# Patient Record
Sex: Male | Born: 1965
Health system: Southern US, Community
[De-identification: ages and names within clinical notes are randomized; demographics above are authoritative.]

## PROBLEM LIST (undated history)

## (undated) DIAGNOSIS — Z720 Tobacco use: Secondary | ICD-10-CM

## (undated) DIAGNOSIS — R9439 Abnormal result of other cardiovascular function study: Secondary | ICD-10-CM

## (undated) DIAGNOSIS — F319 Bipolar disorder, unspecified: Secondary | ICD-10-CM

## (undated) DIAGNOSIS — I4892 Unspecified atrial flutter: Secondary | ICD-10-CM

## (undated) DIAGNOSIS — F1010 Alcohol abuse, uncomplicated: Secondary | ICD-10-CM

## (undated) HISTORY — PX: OTHER SURGICAL HISTORY: SHX169

## (undated) HISTORY — DX: Abnormal result of other cardiovascular function study: R94.39

---

## 2000-07-26 ENCOUNTER — Emergency Department (HOSPITAL_COMMUNITY): Admission: EM | Admit: 2000-07-26 | Discharge: 2000-07-26 | Payer: Self-pay | Admitting: Emergency Medicine

## 2000-07-26 ENCOUNTER — Encounter: Payer: Self-pay | Admitting: Emergency Medicine

## 2000-07-29 ENCOUNTER — Emergency Department (HOSPITAL_COMMUNITY): Admission: EM | Admit: 2000-07-29 | Discharge: 2000-07-29 | Payer: Self-pay | Admitting: Emergency Medicine

## 2000-08-10 ENCOUNTER — Encounter: Payer: Self-pay | Admitting: Emergency Medicine

## 2000-08-10 ENCOUNTER — Emergency Department (HOSPITAL_COMMUNITY): Admission: EM | Admit: 2000-08-10 | Discharge: 2000-08-10 | Payer: Self-pay | Admitting: Emergency Medicine

## 2000-08-15 ENCOUNTER — Emergency Department (HOSPITAL_COMMUNITY): Admission: EM | Admit: 2000-08-15 | Discharge: 2000-08-15 | Payer: Self-pay | Admitting: Emergency Medicine

## 2000-08-20 ENCOUNTER — Emergency Department (HOSPITAL_COMMUNITY): Admission: EM | Admit: 2000-08-20 | Discharge: 2000-08-20 | Payer: Self-pay | Admitting: Emergency Medicine

## 2014-03-27 ENCOUNTER — Inpatient Hospital Stay (HOSPITAL_COMMUNITY)
Admission: EM | Admit: 2014-03-27 | Discharge: 2014-03-31 | DRG: 273 | Disposition: A | Discharge status: Home / Self Care | Payer: Self-pay | Attending: Internal Medicine | Admitting: Internal Medicine

## 2014-03-27 ENCOUNTER — Encounter (HOSPITAL_COMMUNITY): Payer: Self-pay | Admitting: Physical Medicine and Rehabilitation

## 2014-03-27 ENCOUNTER — Emergency Department (HOSPITAL_COMMUNITY): Payer: Self-pay

## 2014-03-27 DIAGNOSIS — F1290 Cannabis use, unspecified, uncomplicated: Secondary | ICD-10-CM | POA: Diagnosis present

## 2014-03-27 DIAGNOSIS — Z720 Tobacco use: Secondary | ICD-10-CM

## 2014-03-27 DIAGNOSIS — I4891 Unspecified atrial fibrillation: Secondary | ICD-10-CM | POA: Diagnosis present

## 2014-03-27 DIAGNOSIS — Z801 Family history of malignant neoplasm of trachea, bronchus and lung: Secondary | ICD-10-CM

## 2014-03-27 DIAGNOSIS — F3130 Bipolar disorder, current episode depressed, mild or moderate severity, unspecified: Secondary | ICD-10-CM | POA: Diagnosis present

## 2014-03-27 DIAGNOSIS — I483 Typical atrial flutter: Principal | ICD-10-CM | POA: Diagnosis present

## 2014-03-27 DIAGNOSIS — D72829 Elevated white blood cell count, unspecified: Secondary | ICD-10-CM | POA: Diagnosis present

## 2014-03-27 DIAGNOSIS — F1020 Alcohol dependence, uncomplicated: Secondary | ICD-10-CM

## 2014-03-27 DIAGNOSIS — I4892 Unspecified atrial flutter: Secondary | ICD-10-CM

## 2014-03-27 DIAGNOSIS — E860 Dehydration: Secondary | ICD-10-CM | POA: Diagnosis present

## 2014-03-27 DIAGNOSIS — I5021 Acute systolic (congestive) heart failure: Secondary | ICD-10-CM | POA: Diagnosis present

## 2014-03-27 DIAGNOSIS — Z66 Do not resuscitate: Secondary | ICD-10-CM | POA: Diagnosis present

## 2014-03-27 DIAGNOSIS — Z7982 Long term (current) use of aspirin: Secondary | ICD-10-CM

## 2014-03-27 DIAGNOSIS — F17210 Nicotine dependence, cigarettes, uncomplicated: Secondary | ICD-10-CM | POA: Diagnosis present

## 2014-03-27 DIAGNOSIS — R05 Cough: Secondary | ICD-10-CM | POA: Diagnosis present

## 2014-03-27 DIAGNOSIS — Z597 Insufficient social insurance and welfare support: Secondary | ICD-10-CM

## 2014-03-27 DIAGNOSIS — Z8249 Family history of ischemic heart disease and other diseases of the circulatory system: Secondary | ICD-10-CM

## 2014-03-27 DIAGNOSIS — D751 Secondary polycythemia: Secondary | ICD-10-CM | POA: Diagnosis present

## 2014-03-27 DIAGNOSIS — F411 Generalized anxiety disorder: Secondary | ICD-10-CM | POA: Diagnosis present

## 2014-03-27 HISTORY — DX: Unspecified atrial flutter: I48.92

## 2014-03-27 HISTORY — DX: Unspecified atrial fibrillation: I48.91

## 2014-03-27 HISTORY — DX: Bipolar disorder, unspecified: F31.9

## 2014-03-27 HISTORY — DX: Tobacco use: Z72.0

## 2014-03-27 HISTORY — DX: Alcohol dependence, uncomplicated: F10.20

## 2014-03-27 HISTORY — DX: Alcohol abuse, uncomplicated: F10.10

## 2014-03-27 LAB — COMPREHENSIVE METABOLIC PANEL
ALT: 27 U/L (ref 0–53)
AST: 25 U/L (ref 0–37)
Albumin: 4.1 g/dL (ref 3.5–5.2)
Alkaline Phosphatase: 93 U/L (ref 39–117)
Anion gap: 9 (ref 5–15)
BUN: 9 mg/dL (ref 6–23)
CO2: 22 mmol/L (ref 19–32)
Calcium: 9.5 mg/dL (ref 8.4–10.5)
Chloride: 108 mmol/L (ref 96–112)
Creatinine, Ser: 1.04 mg/dL (ref 0.50–1.35)
GFR calc Af Amer: >90 mL/min (ref >90)
GFR calc non Af Amer: 83 mL/min — ABNORMAL LOW (ref >90)
Glucose, Bld: 119 mg/dL — ABNORMAL HIGH (ref 70–99)
Potassium: 4.5 mmol/L (ref 3.5–5.1)
Sodium: 139 mmol/L (ref 135–145)
Total Bilirubin: 0.5 mg/dL (ref 0.3–1.2)
Total Protein: 7.1 g/dL (ref 6.0–8.3)

## 2014-03-27 LAB — CBC WITH DIFFERENTIAL/PLATELET
Basophils Absolute: 0.1 10*3/uL (ref 0.0–0.1)
Basophils Relative: 1 % (ref 0–1)
Eosinophils Absolute: 0.2 10*3/uL (ref 0.0–0.7)
Eosinophils Relative: 2 % (ref 0–5)
HCT: 51.2 % (ref 39.0–52.0)
Hemoglobin: 17.8 g/dL — ABNORMAL HIGH (ref 13.0–17.0)
Lymphocytes Relative: 27 % (ref 12–46)
Lymphs Abs: 2.6 10*3/uL (ref 0.7–4.0)
MCH: 33.8 pg (ref 26.0–34.0)
MCHC: 34.8 g/dL (ref 30.0–36.0)
MCV: 97.2 fL (ref 78.0–100.0)
Monocytes Absolute: 0.5 10*3/uL (ref 0.1–1.0)
Monocytes Relative: 5 % (ref 3–12)
Neutro Abs: 6.2 10*3/uL (ref 1.7–7.7)
Neutrophils Relative %: 65 % (ref 43–77)
Platelets: 267 10*3/uL (ref 150–400)
RBC: 5.27 MIL/uL (ref 4.22–5.81)
RDW: 13.4 % (ref 11.5–15.5)
WBC: 9.5 10*3/uL (ref 4.0–10.5)

## 2014-03-27 LAB — LITHIUM LEVEL: Lithium Lvl: 0.62 mmol/L — ABNORMAL LOW (ref 0.80–1.40)

## 2014-03-27 LAB — RAPID URINE DRUG SCREEN, HOSP PERFORMED
Amphetamines: NOT DETECTED
Barbiturates: NOT DETECTED
Benzodiazepines: NOT DETECTED
Cocaine: NOT DETECTED
Opiates: NOT DETECTED
Tetrahydrocannabinol: POSITIVE — AB

## 2014-03-27 LAB — BRAIN NATRIURETIC PEPTIDE: B Natriuretic Peptide: 169.2 pg/mL — ABNORMAL HIGH (ref 0.0–100.0)

## 2014-03-27 LAB — MAGNESIUM: MAGNESIUM: 2 mg/dL (ref 1.5–2.5)

## 2014-03-27 LAB — I-STAT TROPONIN, ED: TROPONIN I, POC: 0 ng/mL (ref 0.00–0.08)

## 2014-03-27 LAB — PROTIME-INR
INR: 1.01 (ref 0.00–1.49)
PROTHROMBIN TIME: 13.4 s (ref 11.6–15.2)

## 2014-03-27 LAB — APTT: aPTT: 28 seconds (ref 24–37)

## 2014-03-27 LAB — TROPONIN I

## 2014-03-27 LAB — TSH: TSH: 1.178 u[IU]/mL (ref 0.350–4.500)

## 2014-03-27 LAB — D-DIMER, QUANTITATIVE (NOT AT ARMC)

## 2014-03-27 LAB — HEPARIN LEVEL (UNFRACTIONATED): Heparin Unfractionated: >2.2 IU/mL — ABNORMAL HIGH (ref 0.30–0.70)

## 2014-03-27 MED ORDER — ACETAMINOPHEN 325 MG PO TABS
650.0000 mg | ORAL_TABLET | Freq: Four times a day (QID) | ORAL | Status: DC | PRN
  Administered 2014-03-31: 650 mg via ORAL
  Filled 2014-03-27: qty 2

## 2014-03-27 MED ORDER — ADULT MULTIVITAMIN W/MINERALS CH
1.0000 | ORAL_TABLET | Freq: Every day | ORAL | Status: DC
  Administered 2014-03-28 – 2014-03-31 (×4): 1 via ORAL
  Filled 2014-03-27 (×4): qty 1

## 2014-03-27 MED ORDER — LITHIUM CARBONATE 300 MG PO CAPS
1200.0000 mg | ORAL_CAPSULE | Freq: Every day | ORAL | Status: DC
  Administered 2014-03-27 – 2014-03-30 (×4): 1200 mg via ORAL
  Filled 2014-03-27 (×5): qty 4

## 2014-03-27 MED ORDER — HEPARIN BOLUS VIA INFUSION
4000.0000 [IU] | Freq: Once | INTRAVENOUS | Status: AC
  Administered 2014-03-27: 4000 [IU] via INTRAVENOUS
  Filled 2014-03-27: qty 4000

## 2014-03-27 MED ORDER — DILTIAZEM LOAD VIA INFUSION: 10.0000 mg | Freq: Once | INTRAVENOUS | Status: DC

## 2014-03-27 MED ORDER — SODIUM CHLORIDE 0.9 % IV SOLN
INTRAVENOUS | Status: AC
  Administered 2014-03-27: 10 mL/h via INTRAVENOUS

## 2014-03-27 MED ORDER — LORAZEPAM 1 MG PO TABS
1.0000 mg | ORAL_TABLET | Freq: Four times a day (QID) | ORAL | Status: AC | PRN
  Administered 2014-03-27 – 2014-03-30 (×4): 1 mg via ORAL
  Filled 2014-03-27 (×4): qty 1

## 2014-03-27 MED ORDER — LORAZEPAM 2 MG/ML IJ SOLN
1.0000 mg | Freq: Four times a day (QID) | INTRAMUSCULAR | Status: AC | PRN
  Administered 2014-03-28 – 2014-03-29 (×4): 1 mg via INTRAVENOUS
  Filled 2014-03-27 (×5): qty 1

## 2014-03-27 MED ORDER — ACETAMINOPHEN 650 MG RE SUPP: 650.0000 mg | Freq: Four times a day (QID) | RECTAL | Status: DC | PRN

## 2014-03-27 MED ORDER — ONDANSETRON HCL 4 MG PO TABS
4.0000 mg | ORAL_TABLET | Freq: Four times a day (QID) | ORAL | Status: DC | PRN
  Administered 2014-03-30: 4 mg via ORAL
  Filled 2014-03-27: qty 1

## 2014-03-27 MED ORDER — APIXABAN 5 MG PO TABS
5.0000 mg | ORAL_TABLET | Freq: Two times a day (BID) | ORAL | Status: DC
  Administered 2014-03-27 – 2014-03-31 (×8): 5 mg via ORAL
  Filled 2014-03-27 (×9): qty 1

## 2014-03-27 MED ORDER — ALUM & MAG HYDROXIDE-SIMETH 200-200-20 MG/5ML PO SUSP: 30.0000 mL | Freq: Four times a day (QID) | ORAL | Status: DC | PRN

## 2014-03-27 MED ORDER — METOPROLOL TARTRATE 25 MG PO TABS
25.0000 mg | ORAL_TABLET | Freq: Two times a day (BID) | ORAL | Status: DC
  Administered 2014-03-27 – 2014-03-29 (×4): 25 mg via ORAL
  Filled 2014-03-27 (×4): qty 1

## 2014-03-27 MED ORDER — CARBAMAZEPINE 200 MG PO TABS
400.0000 mg | ORAL_TABLET | Freq: Once | ORAL | Status: AC
  Administered 2014-03-27: 400 mg via ORAL
  Filled 2014-03-27: qty 2

## 2014-03-27 MED ORDER — DILTIAZEM LOAD VIA INFUSION
10.0000 mg | Freq: Once | INTRAVENOUS | Status: AC
  Administered 2014-03-27: 10 mg via INTRAVENOUS
  Filled 2014-03-27: qty 10

## 2014-03-27 MED ORDER — ONDANSETRON HCL 4 MG/2ML IJ SOLN: 4.0000 mg | Freq: Four times a day (QID) | INTRAMUSCULAR | Status: DC | PRN

## 2014-03-27 MED ORDER — DILTIAZEM HCL 100 MG IV SOLR
5.0000 mg/h | INTRAVENOUS | Status: DC
  Administered 2014-03-28: 10 mg/h via INTRAVENOUS
  Filled 2014-03-27: qty 100

## 2014-03-27 MED ORDER — SODIUM CHLORIDE 0.9 % IJ SOLN
3.0000 mL | Freq: Two times a day (BID) | INTRAMUSCULAR | Status: DC
  Administered 2014-03-27 – 2014-03-30 (×5): 3 mL via INTRAVENOUS

## 2014-03-27 MED ORDER — VITAMIN B-1 100 MG PO TABS
100.0000 mg | ORAL_TABLET | Freq: Every day | ORAL | Status: DC
  Administered 2014-03-28 – 2014-03-31 (×4): 100 mg via ORAL
  Filled 2014-03-27 (×4): qty 1

## 2014-03-27 MED ORDER — FOLIC ACID 1 MG PO TABS
1.0000 mg | ORAL_TABLET | Freq: Every day | ORAL | Status: DC
  Administered 2014-03-28 – 2014-03-31 (×4): 1 mg via ORAL
  Filled 2014-03-27 (×4): qty 1

## 2014-03-27 MED ORDER — FUROSEMIDE 10 MG/ML IJ SOLN
40.0000 mg | Freq: Two times a day (BID) | INTRAMUSCULAR | Status: DC
  Administered 2014-03-27 – 2014-03-28 (×2): 40 mg via INTRAVENOUS
  Filled 2014-03-27 (×2): qty 4

## 2014-03-27 MED ORDER — NICOTINE 21 MG/24HR TD PT24
21.0000 mg | MEDICATED_PATCH | Freq: Once | TRANSDERMAL | Status: DC
  Administered 2014-03-27: 21 mg via TRANSDERMAL
  Filled 2014-03-27: qty 1

## 2014-03-27 MED ORDER — HEPARIN (PORCINE) IN NACL 100-0.45 UNIT/ML-% IJ SOLN
1100.0000 [IU]/h | INTRAMUSCULAR | Status: DC
  Administered 2014-03-27: 1100 [IU]/h via INTRAVENOUS
  Filled 2014-03-27: qty 250

## 2014-03-27 MED ORDER — POTASSIUM CHLORIDE CRYS ER 20 MEQ PO TBCR
20.0000 meq | EXTENDED_RELEASE_TABLET | Freq: Two times a day (BID) | ORAL | Status: DC
  Administered 2014-03-27 – 2014-03-31 (×8): 20 meq via ORAL
  Filled 2014-03-27 (×8): qty 1

## 2014-03-27 MED ORDER — DILTIAZEM HCL 100 MG IV SOLR
5.0000 mg/h | INTRAVENOUS | Status: DC
  Administered 2014-03-27: 5 mg/h via INTRAVENOUS

## 2014-03-27 MED ORDER — THIAMINE HCL 100 MG/ML IJ SOLN
100.0000 mg | Freq: Every day | INTRAMUSCULAR | Status: DC
  Filled 2014-03-27: qty 2

## 2014-03-27 MED ORDER — SENNOSIDES-DOCUSATE SODIUM 8.6-50 MG PO TABS: 1.0000 | ORAL_TABLET | Freq: Every evening | ORAL | Status: DC | PRN

## 2014-03-27 MED ORDER — CARBAMAZEPINE 200 MG PO TABS
400.0000 mg | ORAL_TABLET | Freq: Every day | ORAL | Status: DC
  Administered 2014-03-28 – 2014-03-30 (×3): 400 mg via ORAL
  Filled 2014-03-27 (×3): qty 2

## 2014-03-27 NOTE — H&P (Signed)
[facility] History and Physical                                                                                    Yaseen Gilberg, is a 49 y.o. male  MRN: [id number]   DOB - [birth year]  Admit Date - 03/27/2014  Outpatient Primary MD for the patient is No primary care provider on file.  With History of -  History reviewed. No pertinent past medical history.    History reviewed. No pertinent past surgical history.  in for   Chief Complaint  Patient presents with  . Chest Pain  . Shortness of Breath     HPI Krystopher Kuenzel  is a 49 y.o. male, with a pmh of manic depression, who presents with fatigue, DOE, and cold symptoms for the past several months.  He has been anxious because these symptoms are not resolving.  Earlier this week he decided he couldn't stand it anymore and planned to come to the ER when his twin sister was available to take him.  He has no hx of hypertension, says his BP has been fine when he goes to mental health appointments, but recently he has checked his BP at the pharmacy twice over the last month and found it to be elevated. He also complains of exertional dyspnea, claims that even walking one block makes her short of breath-he has to stop and take a breath/rest and then continue walking. He denies cp, palpitations orthopnea, PND, and lower extremity swelling.  He has smoked 1.5 packs of cigarettes for the past 30 years.  He drinks approx 2 forty ounce beers daily.    In the ER he is found to be in Aflutter with a rate of 130 - 140 bpm.  His hgb is 17.8.  DDimer is negative and POC troponin is 0.0.  Review of Systems   In addition to the HPI above,  No Fever-chills. ++ headaches.  He takes aspirin for these with some relief.  No changes in vision or hearing. No Abdominal pain, He reports vomiting clear phlegm 2x recently. No Blood in stool or Urine, No dysuria, No new tingling, numbness in any extremity, No recent weight gain or loss,  A full 10 point  Review of Systems was done, except as stated above, all other Review of Systems were negative.  Social History History  Substance Use Topics  . Smoking status: Current Every Day Smoker  . Smokeless tobacco: Not on file  . Alcohol Use: No  Tobacco use 1.5 ppd for 30 years.  ETOH 2 forty ounce beers daily. He works as a Copy.  Family History No family history on file. Mother had a heart attack at age 76.  Father died of lung cancer.  Both are deceased.  Sister had ovarian cancer.   Prior to Admission medications   Not on File    No Known Allergies  Physical Exam  Vitals  Blood pressure 129/95, pulse 131, temperature 98.7 F (37.1 C), temperature source Oral, resp. rate 23, SpO2 97 %.   General:  Wd, thin, male, pleasant and cooperative, appears stated age.  Mildly anxious. Sister at bedside.  Psych:  Normal affect and insight, Not Suicidal or Homicidal, Awake Alert, Oriented X 3.  Neuro:   No F.N deficits, ALL C.Nerves Intact, Strength 5/5 all 4 extremities, Sensation intact all 4 extremities.  ENT:  Ears and Eyes appear Normal, Conjunctivae clear, PERRL, Slightly dry oral mucosa with pale/white roof of mouth. Poor dentition.   Neck:  Supple, No lymphadenopathy appreciated. Flushed pink skin.  Respiratory:  Symmetrical chest wall movement, Good air movement bilaterally, CTAB.  Cardiac: Tachycardic, S1 and S2 auscultated, no murmurs/rubs/gallops, no LE edema noted, no JVD.    Abdomen:  Positive bowel sounds, Soft, Non tender, Non distended,  No masses appreciated  Skin:  No Cyanosis, Normal Skin Turgor, No Skin Rash or Bruise. ++Face and neck skin flushed pink.  Extremities:  Able to move all 4. 5/5 strength in each,  no effusions. ++ clubbing.  Data Review  CBC  Recent Labs Lab 03/27/14 1341  WBC 9.5  HGB 17.8*  HCT 51.2  PLT 267  MCV 97.2  MCH 33.8  MCHC 34.8  RDW 13.4  LYMPHSABS 2.6  MONOABS 0.5  EOSABS 0.2  BASOSABS 0.1    Chemistries    Recent Labs Lab 03/27/14 1341 03/27/14 1417  NA 139  --   K 4.5  --   CL 108  --   CO2 22  --   GLUCOSE 119*  --   BUN 9  --   CREATININE 1.04  --   CALCIUM 9.5  --   MG  --  2.0  AST 25  --   ALT 27  --   ALKPHOS 93  --   BILITOT 0.5  --     CrCl cannot be calculated (Unknown ideal weight.).   Recent Labs  03/27/14 1417  TSH 1.178    Coagulation profile  Recent Labs Lab 03/27/14 1417  INR 1.01     Recent Labs  03/27/14 1417  DDIMER <0.27    Cardiac Enzymes No results for input(s): CKMB, TROPONINI, MYOGLOBIN in the last 168 hours.  Invalid input(s): CK  Invalid input(s): POCBNP  Urinalysis No results found for: COLORURINE, APPEARANCEUR, LABSPEC, PHURINE, GLUCOSEU, HGBUR, BILIRUBINUR, KETONESUR, PROTEINUR, UROBILINOGEN, NITRITE, LEUKOCYTESUR  Imaging results:   Dg Chest 2 View  03/27/2014   CLINICAL DATA:  Chest pain and shortness of breath.  EXAM: CHEST  2 VIEW  COMPARISON:  None currently available  FINDINGS: There is moderate aortic tortuosity for age. No cardiomegaly. The hila are normal.  Mild interstitial coarsening without edema or pneumonia. No effusion or pneumothorax.  IMPRESSION: No active cardiopulmonary disease.   Electronically Signed   By: Tiburcio Pea M.D.   On: 03/27/2014 14:38    My personal review of EKG: NSR, No ST changes noted.   Assessment & Plan  Principal Problem:   Atrial flutter with rapid ventricular response Active Problems:   Tobacco abuse   Alcohol dependence  Atrial flutter with RVR Etiology unclear; alcohol and tobacco use probably involved.  Heart rate is 130-140. Patient was given diltiazem in the ED. Consult cardiology. Cycle cardiac enzymes, check BNP, urine drug screen, and obtain 2D echocardiogram. Telemetry, daily weights, and prn oxygen therapy. Continue diltiazem.CHADS2VASC SCORE IS ZERO.  Exertional dyspnea: Show whether this is from ?COPD (long-standing history of tobacco abuse) vs CHF  (alcohol)-his lungs are currently clear, he has no obvious signs of volume overload at this time. Check echo to assess systolic function. May need outpatient PFTs.  Tobacco abuse Patient has smoked 1.5 ppd x 30  years. He honestly admits he doesn't want to quit or has never really considered it, but knows he probably needs to quit. Give nicotine patches during hospital stay. Please discuss smoking cessation with him.  Alcohol dependence Patient drinks 2 forty ounce beers a day. Ordered CIWA assessments, thiamine, and folate. Ativan prn.  DVT Prophylaxis: Heparin and SCD's.  AM Labs Ordered, also please review Full Orders  Family Communication:   Twin sister at bedside.  Code Status:  DNR-reconfirmed multiple times with patient and twin sister at bedside  Condition:  Stable  Time spent in minutes : 60   York, Tora Kindred PA-C on 03/27/2014 at 4:27 PM  Between 7am to 7pm - Pager - [phone]  After 7pm go to [email or web address] - password [email or web address]  And look for the night coverage person covering me after hours  [facility]   Attending Patient is a 49 year old patient long-standing history of alcohol and tobacco abuse who presents to the hospital complaining of exertional dyspnea and weakness. Found to have A. fib with RVR. Suspect alcohol related atrial fibrillation. He also complains of exertional dyspnea, however has no signs of any volume overload on exam. Continue Cardizem infusion, continue heparin until echocardiogram done-if echocardiogram shows a normal ejection fraction, suspect we can just place him on aspirin instead of long-term anticoagulation.  Windell Norfolk MD

## 2014-03-27 NOTE — ED Provider Notes (Signed)
CSN: [id number]     Arrival date & time 03/27/14  1327 History   First MD Initiated Contact with Patient 03/27/14 1355     Chief Complaint  Patient presents with  . Chest Pain  . Shortness of Breath     (Consider location/radiation/quality/duration/timing/severity/associated sxs/prior Treatment) HPI Comments: Pt primary concern is worsening SOB with exertion over the past 6 months. PMH only bipolar depression, on lithium and carbamazepine, tobacco abuse (1-1.5ppd x 25 years). He does not follow regularly with a doctor, does follow with mental health clinic. States he has gotten worsening SOB and tired with exertion, particularly walking up steps. Denies chest pains, any feelings of his heart racing, heart palpitations. He has had a cough productive intermittently of sputum, has had rhinorrhea and nasal congestion for months as well.   Patient is a 49 y.o. male presenting with chest pain and shortness of breath.  Chest Pain Chest pain location: currently denies any pain. Associated symptoms: cough, dizziness, fatigue, headache and shortness of breath   Associated symptoms: no abdominal pain, no fever and no palpitations   Shortness of Breath Severity:  Moderate Onset quality:  Gradual Duration:  6 months Timing:  Constant Progression:  Worsening Chronicity:  Chronic Context: activity, smoke exposure and URI   Context: not known allergens   Relieved by:  Rest Worsened by:  Exertion, movement and activity Ineffective treatments:  Rest Associated symptoms: chest pain, cough and headaches   Associated symptoms: no abdominal pain, no fever, no sore throat and no wheezing     History reviewed. No pertinent past medical history. History reviewed. No pertinent past surgical history. No family history on file. History  Substance Use Topics  . Smoking status: Current Every Day Smoker  . Smokeless tobacco: Not on file  . Alcohol Use: No    Review of Systems  Constitutional: Positive  for fatigue. Negative for fever.  HENT: Positive for congestion and rhinorrhea. Negative for sore throat.   Respiratory: Positive for cough and shortness of breath. Negative for wheezing.   Cardiovascular: Positive for chest pain. Negative for palpitations and leg swelling.  Gastrointestinal: Negative for abdominal pain.  Genitourinary: Negative for dysuria.  Musculoskeletal: Negative for myalgias and arthralgias.  Neurological: Positive for dizziness and headaches.  Psychiatric/Behavioral: The patient is hyperactive (feels more easily agitated).   All other systems reviewed and are negative.     Allergies  Review of patient's allergies indicates no known allergies.  Home Medications   Prior to Admission medications   Not on File   BP 150/111 mmHg  Pulse 138  Temp(Src) 98.7 F (37.1 C) (Oral)  Resp 22  SpO2 99% Physical Exam  Constitutional: He is oriented to person, place, and time. He appears well-developed and well-nourished. No distress.  HENT:  Head: Normocephalic and atraumatic.  Eyes: Conjunctivae and EOM are normal. Pupils are equal, round, and reactive to light.  Neck: Normal range of motion.  Cardiovascular: Regular rhythm, normal heart sounds and intact distal pulses.  Exam reveals no gallop and no friction rub.   No murmur heard. tachycardia  Pulmonary/Chest: Effort normal. He has wheezes (scant).  Abdominal: Soft. Bowel sounds are normal. He exhibits no distension. There is no tenderness. There is no rebound and no guarding.  Musculoskeletal: He exhibits no edema.  Neurological: He is alert and oriented to person, place, and time. No cranial nerve deficit.  Skin: Skin is warm and dry.  Psychiatric: He has a normal mood and affect. His behavior is normal.  Thought content normal.  Nursing note and vitals reviewed.   ED Course  Procedures (including critical care time) Labs Review Labs Reviewed  CBC WITH DIFFERENTIAL/PLATELET - Abnormal; Notable for the  following:    Hemoglobin 17.8 (*)    All other components within normal limits  COMPREHENSIVE METABOLIC PANEL  TSH  PROTIME-INR  APTT  MAGNESIUM  D-DIMER, QUANTITATIVE  I-STAT TROPOININ, ED    Imaging Review No results found.   EKG Interpretation   Date/Time:  Tuesday March 27 2014 13:40:33 EST Ventricular Rate:  135 PR Interval:  180 QRS Duration: 112 QT Interval:  258 QTC Calculation: 387 R Axis:   110 Text Interpretation:  Atrial flutter Right axis deviation Pulmonary  disease pattern Abnormal ECG Confirmed by Rubin Payor  MD, Harrold Donath [id number]) on  03/27/2014 1:56:54 PM      MDM   Final diagnoses:  Atrial flutter with rapid ventricular response   CHADS2VASc score 1 (1pt for hypertension)  Check CMP, magnesium, CBC, TSH, INR, d-dimer.  Started on diltiazem bolus + drip, still in 130s.   [facility]; SDU for dilt drip. Will consult cardiology to follow along as well.    Nani Ravens, MD 03/28/14 3838  Juliet Rude. Rubin Payor, MD 03/28/14 [phone]

## 2014-03-27 NOTE — Progress Notes (Signed)
ANTICOAGULATION CONSULT NOTE - Initial Consult  Pharmacy Consult for Heparin Indication: atrial fibrillation  No Known Allergies  Patient Measurements: TBW: ~76kg IBW: 68.4kg  Vital Signs: Temp: 98.7 F (37.1 C) (02/02 1356) Temp Source: Oral (02/02 1356) BP: 129/95 mmHg (02/02 1530) Pulse Rate: 131 (02/02 1530)  Labs:  Recent Labs  03/27/14 1341 03/27/14 1417  HGB 17.8*  --   HCT 51.Rodney  --   PLT 267  --   APTT  --  28  LABPROT  --  13.4  INR  --  1.01  CREATININE 1.04  --     CrCl cannot be calculated (Unknown ideal weight.).   Medical History: History reviewed. No pertinent past medical history.  Assessment:  49 yo Black presents on Rodney/Rodney with chest pain and SOB. PMH includes bipolar disorder and tobacco abuse. Found to be in Aflutter with a rate of 130-140 bpm. Pharmacy to dose heparin for Afib. Hgb elevated at 17.8, plts ok. SCr wnl. No s/s of bleed  Goal of Therapy:  Heparin level 0.3-0.7 units/ml Monitor platelets by anticoagulation protocol: Yes   Plan:  Give heparin BOLUS of 4,000 units Start heparin gtt at 1100 units/hr Check HL at 2300 tonight Monitor daily HL, CBC, s/s of bleed  Rodney Black Rodney/Rodney/2016,4:39 PM

## 2014-03-27 NOTE — ED Notes (Signed)
Pt presents to department for evaluation of diffuse chest pain and SOB. Ongoing intermittent for several months, states symptoms became worse today. Denies chest pain at present. Pt states he is SOB on exertion, also states he feels tired all the time. Respirations unlabored. Pt is alert and oriented x4.

## 2014-03-27 NOTE — Consult Note (Addendum)
CARDIOLOGY CONSULT NOTE   Patient ID: Rodney Black MRN: [id number], DOB/AGE: [birth year]   Admit date: 03/27/2014 Date of Consult: 03/27/2014   Primary Physician: No primary care provider on file. Primary Cardiologist: New   Pt. Profile  49 year old male with past medical history of manic depression/bipolar disorder, tobacco abuse, ETOH abuse, however no prior cardiac history present with newly diagnosed a-flutter with RVR  Problem List  Past Medical History  Diagnosis Date  . Manic depression   . Bipolar 1 disorder   . Tobacco abuse   . Alcohol abuse     Past Surgical History  Procedure Laterality Date  . Arm fracture      when patient was young     Allergies  No Known Allergies  HPI   The patient is a 49 year old male with past medical history of manic depression/bipolar disorder, tobacco abuse, ETOH abuse, however no prior cardiac history. Patient has not been having any medical follow-up for several years due to lack of insurance. 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 He does occasionally use marijuana, the last use was several days ago. Since 2 months ago, patient has been noticing increasing fatigue, dyspnea on exertion, however he denies any chest discomfort or palpitation. He denies any significant lower extremity edema, orthopnea or paroxysmal nocturnal dyspnea. He does have chronic cough with increasing productive phlegm recently, however denies any problem laying flat at night. His symptom finally worsened to the degree he decided to seek medical attention at [facility] on 03/27/2014.  On arrival his blood pressure was 139/105. O2 saturation 98% on room air. His heart rate was in the 130s. EKG showed 2:1 atrial flutter with RVR which is new for him. He does not appear to have any cardiac awareness of atrial flutter. Initial laboratory finding was significant for creatinine of 1.04, troponin negative,  hemoglobin 17.8. Chest x-ray was negative for acute process. TSH is normal. Cardiology has been consulted for atrial flutter with RVR. Patient was started on diltiazem and heparin drip in the ED.  Inpatient Medications  . nicotine  21 mg Transdermal Once    Family History Family History  Problem Relation Age of Onset  . Hypertension Mother   . Heart attack Mother 29  . Lung cancer Father     heavy smoker     Social History History   Social History  . Marital Status: Single    Spouse Name: N/A    Number of Children: N/A  . Years of Education: N/A   Occupational History  . janitor    Social History Main Topics  . Smoking status: Current Every Day Smoker  . Smokeless tobacco: Not on file     Comment: 1.5 pack per day since high school  . Alcohol Use: 0.0 oz/week    0 Not specified per week     Comment: 2x 40 oz beers daily  . Drug Use: Yes     Comment: occasional marijuana use  . Sexual Activity: Not on file   Other Topics Concern  . Not on file   Social History Narrative     Review of Systems  General:  No chills, fever, night sweats or weight changes.  Cardiovascular:  No chest pain, edema, orthopnea, palpitations, paroxysmal nocturnal dyspnea. +dyspnea on exertion Dermatological: No rash, lesions/masses Respiratory: No dyspnea. Chronic productive cough Urologic: No hematuria, dysuria Abdominal:   No nausea, vomiting, diarrhea, bright red  blood per rectum, melena, or hematemesis Neurologic:  No visual changes, changes in mental status. +wkns, fatigue All other systems reviewed and are otherwise negative except as noted above.  Physical Exam  Blood pressure 150/112, pulse 133, temperature 98.7 F (37.1 C), temperature source Oral, resp. rate 21, height  (1.727 m), weight 168 lb (76.204 kg), SpO2 97 %.  General: Pleasant, NAD Psych: Normal affect. Neuro: Alert and oriented X 3. Moves all extremities spontaneously. HEENT: Normal  Neck: Supple without  bruits or JVD. Lungs:  Resp regular and unlabored, CTA. Heart: tachycardic. no s3, s4, or murmurs. Abdomen: Soft, non-tender, non-distended, BS + x 4.  Extremities: No clubbing, cyanosis or edema. DP/PT/Radials 2+ and equal bilaterally.  Labs  No results for input(s): CKTOTAL, CKMB, TROPONINI in the last 72 hours. Lab Results  Component Value Date   WBC 9.5 03/27/2014   HGB 17.8* 03/27/2014   HCT 51.2 03/27/2014   MCV 97.2 03/27/2014   PLT 267 03/27/2014     Recent Labs Lab 03/27/14 1341  NA 139  K 4.5  CL 108  CO2 22  BUN 9  CREATININE 1.04  CALCIUM 9.5  PROT 7.1  BILITOT 0.5  ALKPHOS 93  ALT 27  AST 25  GLUCOSE 119*   No results found for: CHOL, HDL, LDLCALC, TRIG Lab Results  Component Value Date   DDIMER <0.27 03/27/2014    Radiology/Studies  Dg Chest 2 View  03/27/2014   CLINICAL DATA:  Chest pain and shortness of breath.  EXAM: CHEST  2 VIEW  COMPARISON:  None currently available  FINDINGS: There is moderate aortic tortuosity for age. No cardiomegaly. The hila are normal.  Mild interstitial coarsening without edema or pneumonia. No effusion or pneumothorax.  IMPRESSION: No active cardiopulmonary disease.   Electronically Signed   By: Tiburcio Pea M.D.   On: 03/27/2014 14:38    ECG  2:1 Aflutter with RVR  ASSESSMENT AND PLAN  1. Newly diagnosed a-flutter with RVR: unknown duration  - TSH normal. Likely related to chronic ETOH abuse.   - Obtain echo, would not be surprised if EF is lower now given unknown duration of a-flutter with RVR. However, lack of HF symptom is exam is a good sign    - Consider d/c IV heparin and start on NOAC.   - Continue IV diltiazem, uptitrate overnight to keep HR <100 at rest. Add metoprolol  BID as he will likely have decreased EF.  - will decide on TEE with cardioversion (likely Thurs) vs cardiovert after anticoagulation in 3 weeks depend on ability for rate control and EF on echo. May benefit from outpt myoview later  depend on echo finding  - long discussion regarding ETOH cessation  2. Bipolar  3. Lack of medical care: no insurance, has not seen a PCP in years. Need social worker consult   Signed, Azalee Course, Cordelia Poche 03/27/2014, 5:51 PM   Attending Note:   The patient was seen and examined.  Agree with assessment and plan as noted above.  Changes made to the above note as needed.  Pt presents with progressive dyspnea.  Is in atrial flutter with RVR but cannot tell that his  HR is fast.  Its possible that he has had aflutter with RVR for a while and now has rate related cardiomyopathy. 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  1. Atrial  flutter with RVR:   Will slow his HR  - continue diltiazem . Add metoprolol 25 bid. He needs diuresis - will give IV lasix if he hasn't already had some. Echo tomorrow. His CHADS 2VASC score is 1-2 ( HTN and CHF ) Will start on Eliquis and anticipate TEE cardioversion on Thursday.  Hopefully , we will be able to provide him samples for a month following cardioversion  2. Acute CHF - echo tomorrow.   We do not know his LV function but certainly could have significant LV dysfucntion from tachycardia or ETOH.  Will start metoprolol 25 BID, titrate up as tolerated Add lasix  3.  Erythrocytosis:  Possibly due to his smoking.  I've advised him to stop smoking    Vesta Mixer, Montez Hageman., MD, [facility] 03/27/2014, 5:57 PM [address]  [address] Office [phone] Pager [phone]

## 2014-03-27 NOTE — ED Notes (Signed)
Patient transported to X-ray 

## 2014-03-28 DIAGNOSIS — F10230 Alcohol dependence with withdrawal, uncomplicated: Secondary | ICD-10-CM

## 2014-03-28 DIAGNOSIS — I4892 Unspecified atrial flutter: Secondary | ICD-10-CM

## 2014-03-28 LAB — BASIC METABOLIC PANEL
ANION GAP: 8 (ref 5–15)
BUN: 7 mg/dL (ref 6–23)
CALCIUM: 9.4 mg/dL (ref 8.4–10.5)
CHLORIDE: 105 mmol/L (ref 96–112)
CO2: 24 mmol/L (ref 19–32)
Creatinine, Ser: 0.94 mg/dL (ref 0.50–1.35)
GFR calc non Af Amer: >90 mL/min (ref >90)
Glucose, Bld: 115 mg/dL — ABNORMAL HIGH (ref 70–99)
Potassium: 3.6 mmol/L (ref 3.5–5.1)
Sodium: 137 mmol/L (ref 135–145)

## 2014-03-28 LAB — CBC
HEMATOCRIT: 50.1 % (ref 39.0–52.0)
HEMOGLOBIN: 17.5 g/dL — AB (ref 13.0–17.0)
MCH: 34.2 pg — ABNORMAL HIGH (ref 26.0–34.0)
MCHC: 34.9 g/dL (ref 30.0–36.0)
MCV: 97.9 fL (ref 78.0–100.0)
PLATELETS: 245 10*3/uL (ref 150–400)
RBC: 5.12 MIL/uL (ref 4.22–5.81)
RDW: 13.2 % (ref 11.5–15.5)
WBC: 12.4 10*3/uL — ABNORMAL HIGH (ref 4.0–10.5)

## 2014-03-28 LAB — TROPONIN I
Troponin I: <0.03 ng/mL (ref <0.031)
Troponin I: 0.03 ng/mL (ref <0.031)

## 2014-03-28 MED ORDER — DILTIAZEM HCL ER COATED BEADS 240 MG PO CP24
240.0000 mg | ORAL_CAPSULE | Freq: Every day | ORAL | Status: DC
  Administered 2014-03-28 – 2014-03-31 (×4): 240 mg via ORAL
  Filled 2014-03-28 (×4): qty 1

## 2014-03-28 MED ORDER — NICOTINE 21 MG/24HR TD PT24
21.0000 mg | MEDICATED_PATCH | Freq: Every day | TRANSDERMAL | Status: DC
  Administered 2014-03-28 – 2014-03-30 (×3): 21 mg via TRANSDERMAL
  Filled 2014-03-28 (×4): qty 1

## 2014-03-28 MED ORDER — OFF THE BEAT BOOK
Freq: Once | Status: DC
  Filled 2014-03-28: qty 1

## 2014-03-28 NOTE — Progress Notes (Signed)
Patient Demographics  Rodney Black, is a 49 y.o. male, DOB - 04/07/1965, ZHY:[id number]  Admit date - 03/27/2014   Admitting Physician Maretta Bees, MD  Outpatient Primary MD for the patient is No primary care provider on file.  LOS - 1   Chief Complaint  Patient presents with  . Chest Pain  . Shortness of Breath        Subjective:   [name] today has, No headache, No chest pain, No abdominal pain - No Nausea, No new weakness tingling or numbness, No Cough - SOB. Seems anxious and irritable, on alcohol withdrawal protocol. Still in aflutter, but rate better controlled.  Assessment & Plan    Principal Problem:   Atrial flutter with rapid ventricular response Active Problems:   Tobacco abuse   Alcohol dependence   A-fib  Atrial flutter with RVR Etiology unclear; alcohol and tobacco use probably involved. Heart rate is 130-140 on admission. Patient was given diltiazem in the ED. Consult cardiology. Cycle cardiac enzymes, check BNP, urine drug screen, and obtain 2D echocardiogram. Telemetry, daily weights, and prn oxygen therapy. Continue diltiazem.CHADS2VASC SCORE IS 1. Was on cardizem drip, transitioned to oral cardizem on 2/3. On apixaban for now, significant drug interaction with tegretol. In discussion with cardiology/pharmacy for better choice of anti coagualation, possible cardioversion this admission per cardiology.  Exertional dyspnea: Likely from chronic aflutter/afib. Lung clears on exam, awaiting echo result. D/c lasix on 2/3. Show whether this is from ?COPD (long-standing history of tobacco abuse) vs CHF (alcohol)-his lungs are currently clear, he has no obvious signs of volume overload at this time. Check echo to assess systolic function. May need outpatient PFTs.  Tobacco abuse Patient has smoked 1.5 ppd x 30 years. He honestly admits he doesn't want to quit or has  never really considered it, but knows he probably needs to quit. Give nicotine patches during hospital stay. smoking cessation education provided  Alcohol dependence Patient drinks 2 forty ounce beers a day. Ordered CIWA assessments, thiamine, and folate. Ativan prn.  Code Status: DNR  Family Communication: patient  Disposition Plan: remain in patient, likely will need cardioversion during this admission   Procedures echo   Consults  cardiology   Medications  Scheduled Meds: . apixaban  5 mg Oral BID  . carbamazepine  400 mg Oral QHS  . diltiazem  240 mg Oral Daily  . folic acid  1 mg Oral Daily  . lithium carbonate  1,200 mg Oral QHS  . metoprolol tartrate  25 mg Oral BID  . multivitamin with minerals  1 tablet Oral Daily  . nicotine  21 mg Transdermal Once  . potassium chloride  20 mEq Oral BID  . sodium chloride  3 mL Intravenous Q12H  . thiamine  100 mg Oral Daily   Or  . thiamine  100 mg Intravenous Daily   Continuous Infusions:  PRN Meds:.acetaminophen **OR** acetaminophen, alum & mag hydroxide-simeth, LORazepam **OR** LORazepam, ondansetron **OR** ondansetron (ZOFRAN) IV, senna-docusate  DVT Prophylaxis  On apixaban  Lab Results  Component Value Date   PLT 245 03/28/2014    Antibiotics  none  Anti-infectives    None          Objective:   Filed Vitals:  03/27/14 2156 03/28/14 0515 03/28/14 0755 03/28/14 1130  BP: 139/119 102/73 105/62 106/67  Pulse: 56 64 129 80  Temp: 98.8 F (37.1 C) 98.2 F (36.8 C) 98.1 F (36.7 C) 98.2 F (36.8 C)  TempSrc: Oral Oral Oral Oral  Resp: 20 18 18 18   Height: 5\' 8"  (1.727 m)     Weight: 71.895 kg (158 lb 8 oz) 71.479 kg (157 lb 9.3 oz)    SpO2: 98% 95% 95% 97%    Wt Readings from Last 3 Encounters:  03/28/14 71.479 kg (157 lb 9.3 oz)     Intake/Output Summary (Last 24 hours) at 03/28/14 1328 Last data filed at 03/28/14 1320  Gross per 24 hour  Intake    240 ml  Output   2950 ml  Net  -2710  ml     Physical Exam  Awake Alert, Oriented X 3, No new F.N deficits, Normal affect [place].AT,PERRAL Supple Neck,No JVD, No cervical lymphadenopathy appriciated.  Symmetrical Chest wall movement, Good air movement bilaterally, CTAB In aflutter,No Gallops,Rubs or new Murmurs, No Parasternal Heave +ve B.Sounds, Abd Soft, No tenderness, No organomegaly appriciated, No rebound - guarding or rigidity. No Cyanosis, Clubbing or edema, No new Rash or bruise     Data Review   Micro Results No results found for this or any previous visit (from the past 240 hour(s)).  Radiology Reports Dg Chest 2 View  03/27/2014   CLINICAL DATA:  Chest pain and shortness of breath.  EXAM: CHEST  2 VIEW  COMPARISON:  None currently available  FINDINGS: There is moderate aortic tortuosity for age. No cardiomegaly. The hila are normal.  Mild interstitial coarsening without edema or pneumonia. No effusion or pneumothorax.  IMPRESSION: No active cardiopulmonary disease.   Electronically Signed   By: Tiburcio Pea M.D.   On: 03/27/2014 14:38     CBC  Recent Labs Lab 03/27/14 1341 03/28/14 0107  WBC 9.5 12.4*  HGB 17.8* 17.5*  HCT 51.2 50.1  PLT 267 245  MCV 97.2 97.9  MCH 33.8 34.2*  MCHC 34.8 34.9  RDW 13.4 13.2  LYMPHSABS 2.6  --   MONOABS 0.5  --   EOSABS 0.2  --   BASOSABS 0.1  --     Chemistries   Recent Labs Lab 03/27/14 1341 03/27/14 1417 03/28/14 0800  NA 139  --  137  K 4.5  --  3.6  CL 108  --  105  CO2 22  --  24  GLUCOSE 119*  --  115*  BUN 9  --  7  CREATININE 1.04  --  0.94  CALCIUM 9.5  --  9.4  MG  --  2.0  --   AST 25  --   --   ALT 27  --   --   ALKPHOS 93  --   --   BILITOT 0.5  --   --    ------------------------------------------------------------------------------------------------------------------ estimated creatinine clearance is 93 mL/min (by C-G formula based on Cr of  0.94). ------------------------------------------------------------------------------------------------------------------ No results for input(s): HGBA1C in the last 72 hours. ------------------------------------------------------------------------------------------------------------------ No results for input(s): CHOL, HDL, LDLCALC, TRIG, CHOLHDL, LDLDIRECT in the last 72 hours. ------------------------------------------------------------------------------------------------------------------  Recent Labs  03/27/14 1417  TSH 1.178   ------------------------------------------------------------------------------------------------------------------ No results for input(s): VITAMINB12, FOLATE, FERRITIN, TIBC, IRON, RETICCTPCT in the last 72 hours.  Coagulation profile  Recent Labs Lab 03/27/14 1417  INR 1.01     Recent Labs  03/27/14 1417  DDIMER <0.27  Cardiac Enzymes  Recent Labs Lab 03/27/14 2109 03/28/14 0107 03/28/14 0800  TROPONINI <0.03 <0.03 0.03   ------------------------------------------------------------------------------------------------------------------ Invalid input(s): POCBNP        Leory Allinson M.D on 03/28/2014 at 1:28 PM  Between 7am to 7pm - Pager - [phone]  After 7pm go to [email or web address] - password [facility]  [facility] Office  [phone]

## 2014-03-28 NOTE — Progress Notes (Addendum)
Patient Name: Rodney Black Date of Encounter: 03/28/2014     Principal Problem:   Atrial flutter with rapid ventricular response Active Problems:   Tobacco abuse   Alcohol dependence   A-fib    SUBJECTIVE  Denies any SOB or CP.  CURRENT MEDS . apixaban  5 mg Oral BID  . carbamazepine  400 mg Oral QHS  . folic acid  1 mg Oral Daily  . furosemide  40 mg Intravenous BID  . lithium carbonate  1,200 mg Oral QHS  . metoprolol tartrate  25 mg Oral BID  . multivitamin with minerals  1 tablet Oral Daily  . nicotine  21 mg Transdermal Once  . potassium chloride  20 mEq Oral BID  . sodium chloride  3 mL Intravenous Q12H  . thiamine  100 mg Oral Daily   Or  . thiamine  100 mg Intravenous Daily    OBJECTIVE  Filed Vitals:   03/27/14 2130 03/27/14 2156 03/28/14 0515 03/28/14 0755  BP: 134/104 139/119 102/73 105/62  Pulse: 141 56 64 129  Temp:  98.8 F (37.1 C) 98.2 F (36.8 C) 98.1 F (36.7 C)  TempSrc:  Oral Oral Oral  Resp: Height:   (1.727 m)    Weight:  158 lb 8 oz (71.895 kg) 157 lb 9.3 oz (71.479 kg)   SpO2: 96% 98% 95% 95%    Intake/Output Summary (Last 24 hours) at 03/28/14 0829 Last data filed at 03/28/14 0514  Gross per 24 hour  Intake      0 ml  Output   2450 ml  Net  -2450 ml   Filed Weights   03/27/14 1700 03/27/14 2156 03/28/14 0515  Weight: 168 lb (76.204 kg) 158 lb 8 oz (71.895 kg) 157 lb 9.3 oz (71.479 kg)    PHYSICAL EXAM  General: Pleasant, NAD. Neuro: Alert and oriented X 3. Moves all extremities spontaneously. Psych: Normal affect. HEENT:  Normal  Neck: Supple without bruits or JVD. Lungs:  Resp regular and unlabored, CTA. Heart: RRR no s3, s4, or murmurs. Abdomen: Soft, non-tender, non-distended, BS + x 4.  Extremities: No clubbing, cyanosis or edema. DP/PT/Radials 2+ and equal bilaterally.  Accessory Clinical Findings  CBC  Recent Labs  03/27/14 1341 03/28/14 0107  WBC 9.5 12.4*  NEUTROABS 6.2  --     HGB 17.8* 17.5*  HCT 51.2 50.1  MCV 97.2 97.9  PLT 267 245   Basic Metabolic Panel  Recent Labs  03/27/14 1341 03/27/14 1417  NA 139  --   K 4.5  --   CL 108  --   CO2 22  --   GLUCOSE 119*  --   BUN 9  --   CREATININE 1.04  --   CALCIUM 9.5  --   MG  --  2.0   Liver Function Tests  Recent Labs  03/27/14 1341  AST 25  ALT 27  ALKPHOS 93  BILITOT 0.5  PROT 7.1  ALBUMIN 4.1   Cardiac Enzymes  Recent Labs  03/27/14 2109 03/28/14 0107  TROPONINI <0.03 <0.03   BNP Invalid input(s): POCBNP D-Dimer  Recent Labs  03/27/14 1417  DDIMER <0.27   Thyroid Function Tests  Recent Labs  03/27/14 1417  TSH 1.178    TELE Aflutter with HR 70-80s    ECG  No EKG  Echocardiogram  pending    Radiology/Studies  Dg Chest 2 View  03/27/2014   CLINICAL DATA:  Chest  pain and shortness of breath.  EXAM: CHEST  2 VIEW  COMPARISON:  None currently available  FINDINGS: There is moderate aortic tortuosity for age. No cardiomegaly. The hila are normal.  Mild interstitial coarsening without edema or pneumonia. No effusion or pneumothorax.  IMPRESSION: No active cardiopulmonary disease.   Electronically Signed   By: Tiburcio Pea M.D.   On: 03/27/2014 14:38    ASSESSMENT AND PLAN  1. Newly diagnosed a-flutter with RVR: unknown duration  - CHA2-DS2-Vasc score 1 or 2 (+/- HTN and heart failure) - TSH normal. Likely related to chronic ETOH abuse.  - pending echo, would not be surprised if EF is lower now given unknown duration of a-flutter with RVR.    - started on eliquis 5mg  BID.   - long discussion regarding ETOH cessation  - pending echo today, may need TEE cardioversion Thur or Fri (if need 5 doses of eliquis, then would be Fri) vs come back in 3 weeks and have DCCV w/o TEE  - check BMET, significant urine output yesterday, doubt patient need further diuresis. Will followup on echo result and BMET.   - currently  rate controlled.  switched IV diltiazem to 240mg  PO diltiazem   Addendum: Discussed with pharmacist, there is a contraindication with apixaban and Tegretol as combination of the 2 may decrease apixaban. This patient is not the most ideal pt to be on coumadin, no insurance, complicated social situation, however may need coumadin anyway.   2. Bipolar  3. Lack of medical care: no insurance, has not seen a PCP in years. Need social worker consult  4. Mild acute CHF  - on IV lasix, check Cr  - pending echo today  5. DNR status  Signed, Azalee Course PA-C Pager: [phone]   I have examined the patient and reviewed assessment and plan and discussed with patient.  Agree with above as stated.  Limiting issue is interaction of NOAC with tegretol.  Compliance will be an issues with coumadin.  ?Ablation + savaysa for a month.  Savayasa has less likelihood for interaction.  Double check with pharmacy given that he may have a high CrCl. Await EP opinion.   He should avoid tobacco and alcohol.   F/U cardiology care with Dr. Elease Hashimoto.  He initially planned for TEE/CV later this week, but likely did not know about the NOAC, Tegretol issue.    VARANASI,JAYADEEP S.

## 2014-03-28 NOTE — Care Management Note (Addendum)
    Page 1 of 1   03/31/2014     9:56:47 AM CARE MANAGEMENT NOTE 03/31/2014  Patient:  Rodney Black, Rodney Black   Account Number:  [id number]  Date Initiated:  03/28/2014  Documentation initiated by:  GRAVES-BIGELOW,Knox Cervi  Subjective/Objective Assessment:   Pt admitted for Afib RVR: Pt is without insurance. Per Pharmacy coumadin may be best choice for therapy. Cardiology is following the pt here. Pt can be set up at the [facility] at Cardiology office.     Action/Plan:   CM did call the Financial Cousnelor for possible assistance with bill. CM will set the pt up for PCP at the [facility].   Anticipated DC Date:  03/30/2014   Anticipated DC Plan:  HOME/SELF CARE  In-house referral  Financial Counselor      DC Planning Services  CM consult  Follow-up appt scheduled  [facility]      Choice offered to / List presented to:             Status of service:  Completed, signed off Medicare Important Message given?  NO (If response is "NO", the following Medicare IM given date fields will be blank) Date Medicare IM given:   Medicare IM given by:   Date Additional Medicare IM given:   Additional Medicare IM given by:    Discharge Disposition:  HOME/SELF CARE  Per UR Regulation:  Reviewed for med. necessity/level of care/duration of stay  If discussed at Long Length of Stay Meetings, dates discussed:    Comments:  [facility] follow up appointment scheduled for 04-05-14 at 1115 to arrive at 11 am. Appointment placed on AVS form. CM did provide pt with 30 day free card and physician to fill out Patient assitance form on the shadow chart.  Pt is aware of the [facility] $4.00 list as well. If he has further issues with Medication- Pt can get assistance at the [facility]. No further needs from CM at this time.  Tomi Bamberger, RN,BSN [phone]

## 2014-03-28 NOTE — Progress Notes (Signed)
UR Completed Sherilyn Windhorst Graves-Bigelow, RN,BSN [phone]  

## 2014-03-28 NOTE — Progress Notes (Signed)
  Echocardiogram 2D Echocardiogram has been performed.  Cathie Beams 03/28/2014, 10:51 AM

## 2014-03-29 DIAGNOSIS — F10288 Alcohol dependence with other alcohol-induced disorder: Secondary | ICD-10-CM

## 2014-03-29 DIAGNOSIS — F411 Generalized anxiety disorder: Secondary | ICD-10-CM

## 2014-03-29 LAB — HEMOGLOBIN A1C
HEMOGLOBIN A1C: 5.7 % — AB (ref 4.8–5.6)
Mean Plasma Glucose: 117 mg/dL

## 2014-03-29 LAB — CBC
HCT: 49.8 % (ref 39.0–52.0)
HEMOGLOBIN: 17.2 g/dL — AB (ref 13.0–17.0)
MCH: 33.5 pg (ref 26.0–34.0)
MCHC: 34.5 g/dL (ref 30.0–36.0)
MCV: 97.1 fL (ref 78.0–100.0)
Platelets: 243 10*3/uL (ref 150–400)
RBC: 5.13 MIL/uL (ref 4.22–5.81)
RDW: 13.1 % (ref 11.5–15.5)
WBC: 12.5 10*3/uL — AB (ref 4.0–10.5)

## 2014-03-29 LAB — URINALYSIS, ROUTINE W REFLEX MICROSCOPIC
Bilirubin Urine: NEGATIVE
GLUCOSE, UA: NEGATIVE mg/dL
Hgb urine dipstick: NEGATIVE
Ketones, ur: NEGATIVE mg/dL
Leukocytes, UA: NEGATIVE
Nitrite: NEGATIVE
PROTEIN: NEGATIVE mg/dL
SPECIFIC GRAVITY, URINE: 1.01 (ref 1.005–1.030)
UROBILINOGEN UA: 0.2 mg/dL (ref 0.0–1.0)
pH: 6.5 (ref 5.0–8.0)

## 2014-03-29 MED ORDER — SODIUM CHLORIDE 0.9 % IV SOLN: INTRAVENOUS | Status: DC

## 2014-03-29 MED ORDER — SODIUM CHLORIDE 0.9 % IV SOLN
INTRAVENOUS | Status: DC
  Administered 2014-03-29: 18:00:00 via INTRAVENOUS

## 2014-03-29 MED ORDER — CARVEDILOL 6.25 MG PO TABS
6.2500 mg | ORAL_TABLET | Freq: Two times a day (BID) | ORAL | Status: DC
  Administered 2014-03-29 – 2014-03-31 (×3): 6.25 mg via ORAL
  Filled 2014-03-29 (×3): qty 1

## 2014-03-29 MED ORDER — SODIUM CHLORIDE 0.9 % IJ SOLN
3.0000 mL | Freq: Two times a day (BID) | INTRAMUSCULAR | Status: DC
  Administered 2014-03-29: 3 mL via INTRAVENOUS

## 2014-03-29 MED ORDER — SODIUM CHLORIDE 0.9 % IV SOLN: 250.0000 mL | INTRAVENOUS | Status: DC

## 2014-03-29 MED ORDER — LISINOPRIL 5 MG PO TABS
5.0000 mg | ORAL_TABLET | Freq: Every day | ORAL | Status: DC
  Administered 2014-03-29 – 2014-03-31 (×3): 5 mg via ORAL
  Filled 2014-03-29 (×3): qty 1

## 2014-03-29 MED ORDER — SODIUM CHLORIDE 0.9 % IJ SOLN: 3.0000 mL | INTRAMUSCULAR | Status: DC | PRN

## 2014-03-29 NOTE — Progress Notes (Signed)
Discussed with EP service who will see the patient today. Will tentatively add patient on for TEE cardioversion tomorrow. Biomedical scientist notified and will schedule  Signed, Azalee Course PA Pager: [phone]

## 2014-03-29 NOTE — Progress Notes (Signed)
Patient Demographics  Rodney Black, is a 49 y.o. male, DOB - Sep 30, 1965, UVO:[id number]  Admit date - 03/27/2014   Admitting Physician Maretta Bees, MD  Outpatient Primary MD for the patient is No primary care provider on file.  LOS - 2   Chief Complaint  Patient presents with  . Chest Pain  . Shortness of Breath        Subjective:   Rodney Black today has, No headache, No chest pain, No abdominal pain - No Nausea, No new weakness tingling or numbness, No Cough - SOB. Very anxious and irritable, on alcohol withdrawal protocol. Still in aflutter, but rate better controlled. State not remember seeing me yesterday. State not remember seeing cardiology either.   Assessment & Plan    Principal Problem:   Atrial flutter with rapid ventricular response Active Problems:   Tobacco abuse   Alcohol dependence   A-fib  Atrial flutter with RVR Etiology unclear; alcohol and tobacco use probably involved. Heart rate is 130-140 on admission. Patient was given diltiazem in the ED. Consult cardiology. Cycle cardiac enzymes, check BNP, urine drug screen, and obtain 2D echocardiogram. Telemetry, daily weights, and prn oxygen therapy. Continue diltiazem.CHADS2VASC SCORE IS 1. Was on cardizem drip, transitioned to oral cardizem on 2/3. On apixaban for now, significant drug interaction with tegretol. In discussion with cardiology/pharmacy for better choice of anti coagualation, possible cardioversion this admission per cardiology. Cards/EP to arrange cardioversion.  Exertional dyspnea: Likely from chronic aflutter/afib. Lung clears on exam, awaiting echo result. D/c lasix on 2/3. Show whether this is from ?COPD (long-standing history of tobacco abuse) vs CHF (alcohol)-his lungs are currently clear, he has no obvious signs of volume overload at this time. Check echo to assess systolic function. May need  outpatient PFTs.  Tobacco abuse Patient has smoked 1.5 ppd x 30 years. He honestly admits he doesn't want to quit or has never really considered it, but knows he probably needs to quit. Give nicotine patches during hospital stay. smoking cessation education provided  Alcohol dependence Patient drinks 2 forty ounce beers a day. Ordered CIWA assessments, thiamine, and folate. Ativan prn.  Leukocytosis: no fever, cxr unremarkable, ua pending, nontoxic. From dehydration?/stress encourage oral intake.  Psych: very anxious, requiring frequent reassurance. Continue home meds tegretol/lithium, patient report has been on these for 53yrs.  Code Status: DNR  Family Communication: patient  Disposition Plan: remain in patient, likely will need cardioversion during this admission   Procedures echo   Consults  cardiology   Medications  Scheduled Meds: . apixaban  5 mg Oral BID  . carbamazepine  400 mg Oral QHS  . diltiazem  240 mg Oral Daily  . folic acid  1 mg Oral Daily  . lithium carbonate  1,200 mg Oral QHS  . metoprolol tartrate  25 mg Oral BID  . multivitamin with minerals  1 tablet Oral Daily  . nicotine  21 mg Transdermal Daily  . off the beat book   Does not apply Once  . potassium chloride  20 mEq Oral BID  . sodium chloride  3 mL Intravenous Q12H  . sodium chloride  3 mL Intravenous Q12H  . thiamine  100 mg Oral Daily   Or  . thiamine  100 mg Intravenous Daily   Continuous Infusions: . sodium chloride     PRN Meds:.acetaminophen **OR** acetaminophen, alum & mag hydroxide-simeth, LORazepam **OR** LORazepam, ondansetron **OR** ondansetron (ZOFRAN) IV, senna-docusate, sodium chloride  DVT Prophylaxis  On apixaban  Lab Results  Component Value Date   PLT 243 03/29/2014    Antibiotics  none  Anti-infectives    None          Objective:   Filed Vitals:   03/29/14 0644 03/29/14 0942 03/29/14 1146 03/29/14 1404  BP: 114/89 127/87 122/85 108/67  Pulse:   125 92 103  Temp:    98.1 F (36.7 C)  TempSrc:    Oral  Resp:      Height:      Weight:      SpO2:    100%    Wt Readings from Last 3 Encounters:  03/29/14 71.986 kg (158 lb 11.2 oz)     Intake/Output Summary (Last 24 hours) at 03/29/14 1549 Last data filed at 03/29/14 0948  Gross per 24 hour  Intake    963 ml  Output      2 ml  Net    961 ml     Physical Exam  Awake Alert, Oriented X 3, No new F.N deficits, very anxious [place].AT,PERRAL Supple Neck,No JVD, No cervical lymphadenopathy appriciated.  Symmetrical Chest wall movement, Good air movement bilaterally, CTAB In aflutter,No Gallops,Rubs or new Murmurs, No Parasternal Heave +ve B.Sounds, Abd Soft, No tenderness, No organomegaly appriciated, No rebound - guarding or rigidity. No Cyanosis, Clubbing or edema, No new Rash or bruise     Data Review   Micro Results No results found for this or any previous visit (from the past 240 hour(s)).  Radiology Reports Dg Chest 2 View  03/27/2014   CLINICAL DATA:  Chest pain and shortness of breath.  EXAM: CHEST  2 VIEW  COMPARISON:  None currently available  FINDINGS: There is moderate aortic tortuosity for age. No cardiomegaly. The hila are normal.  Mild interstitial coarsening without edema or pneumonia. No effusion or pneumothorax.  IMPRESSION: No active cardiopulmonary disease.   Electronically Signed   By: Tiburcio Pea M.D.   On: 03/27/2014 14:38     CBC  Recent Labs Lab 03/27/14 1341 03/28/14 0107 03/29/14 0541  WBC 9.5 12.4* 12.5*  HGB 17.8* 17.5* 17.2*  HCT 51.2 50.1 49.8  PLT 267 245 243  MCV 97.2 97.9 97.1  MCH 33.8 34.2* 33.5  MCHC 34.8 34.9 34.5  RDW 13.4 13.2 13.1  LYMPHSABS 2.6  --   --   MONOABS 0.5  --   --   EOSABS 0.2  --   --   BASOSABS 0.1  --   --     Chemistries   Recent Labs Lab 03/27/14 1341 03/27/14 1417 03/28/14 0800  NA 139  --  137  K 4.5  --  3.6  CL 108  --  105  CO2 22  --  24  GLUCOSE 119*  --  115*  BUN 9  --  7    CREATININE 1.04  --  0.94  CALCIUM 9.5  --  9.4  MG  --  2.0  --   AST 25  --   --   ALT 27  --   --   ALKPHOS 93  --   --   BILITOT 0.5  --   --    ------------------------------------------------------------------------------------------------------------------ estimated creatinine clearance is 93 mL/min (by C-G formula based  on Cr of 0.94). ------------------------------------------------------------------------------------------------------------------  Recent Labs  03/27/14 2237  HGBA1C 5.7*   ------------------------------------------------------------------------------------------------------------------ No results for input(s): CHOL, HDL, LDLCALC, TRIG, CHOLHDL, LDLDIRECT in the last 72 hours. ------------------------------------------------------------------------------------------------------------------  Recent Labs  03/27/14 1417  TSH 1.178   ------------------------------------------------------------------------------------------------------------------ No results for input(s): VITAMINB12, FOLATE, FERRITIN, TIBC, IRON, RETICCTPCT in the last 72 hours.  Coagulation profile  Recent Labs Lab 03/27/14 1417  INR 1.01     Recent Labs  03/27/14 1417  DDIMER <0.27    Cardiac Enzymes  Recent Labs Lab 03/27/14 2109 03/28/14 0107 03/28/14 0800  TROPONINI <0.03 <0.03 0.03   ------------------------------------------------------------------------------------------------------------------ Invalid input(s): POCBNP        Rhiley Solem M.D on 03/29/2014 at 3:49 PM  Between 7am to 7pm - Pager - [phone]  After 7pm go to [email or web address] - password [facility]  [facility] Office  [phone]

## 2014-03-29 NOTE — Clinical Documentation Improvement (Signed)
Supporting Information: Mild acute congestive heart failure, on IV Lasix per 02/03 progress notes.  Labs: 02/02: BNP: 169.2.   Possible Clinical Condition: . Document acuity --Acute --Chronic --Acute on Chronic . Document type --Diastolic --Systolic --Combined systolic and diastolic . Due to or associated with --Cardiac or other surgery --Hypertension --Valvular disease --Rheumatic heart disease Endocarditis (valvitis) Pericarditis Myocarditis --Other (specify)     Thank Gabriel Cirri Documentation Specialist [phone] [email or web address]

## 2014-03-29 NOTE — Consult Note (Signed)
ELECTROPHYSIOLOGY CONSULT NOTE    Patient ID: Rodney Black MRN: [id number], DOB/AGE: Oct 14, 1965 49 y.o.  Admit date: 03/27/2014 Date of Consult: 03/29/2014  Primary Cardiologist: new to [facility]  Reason for Consultation: atrial flutter  HPI:  Rodney Black is a 49 y.o. male with a past medical history significant for manic depression/bipolar disorder who has not had medical care in several years.  He presented to the ER on 03-27-14 with a 2 month history of progressive fatigue and dyspnea on exertion.  He was found to be in atrial flutter with RVR.  Echocardiogram demonstrated EF of 25%.  EP has been asked to evaluate for treatment options.  He has been felt to be a poor candidate for lovenox/ coumadin.  He has been started on eliquis.  Presently, he is resting comfortably and is without complaint.  Past Medical History  Diagnosis Date  . Manic depression   . Bipolar 1 disorder   . Tobacco abuse   . Alcohol abuse   . Atrial flutter with rapid ventricular response 03/27/2014    unknown duration, newly diagnosed 03/2014     Surgical History:  Past Surgical History  Procedure Laterality Date  . Arm fracture      when patient was young     Prescriptions prior to admission  Medication Sig Dispense Refill Last Dose  . aspirin 325 MG tablet Take 325 mg by mouth daily.   Past Week at Unknown time  . carbamazepine (TEGRETOL) 200 MG tablet Take 400 mg by mouth at bedtime.   03/26/2014 at Unknown time  . lithium carbonate 300 MG capsule Take 1,200 mg by mouth at bedtime.   03/26/2014 at Unknown time    Inpatient Medications:  . apixaban  5 mg Oral BID  . carbamazepine  400 mg Oral QHS  . diltiazem  240 mg Oral Daily  . folic acid  1 mg Oral Daily  . lithium carbonate  1,200 mg Oral QHS  . metoprolol tartrate  25 mg Oral BID  . multivitamin with minerals  1 tablet Oral Daily  . nicotine  21 mg Transdermal Daily  . off the beat book   Does not apply Once  . potassium chloride   20 mEq Oral BID  . sodium chloride  3 mL Intravenous Q12H  . thiamine  100 mg Oral Daily   Or  . thiamine  100 mg Intravenous Daily    Allergies: No Known Allergies  History   Social History  . Marital Status: Single    Spouse Name: N/A    Number of Children: N/A  . Years of Education: N/A   Occupational History  . janitor    Social History Main Topics  . Smoking status: Current Every Day Smoker  . Smokeless tobacco: Not on file     Comment: 1.5 pack per day since high school  . Alcohol Use: 0.0 oz/week    0 Not specified per week     Comment: 2x 40 oz beers daily  . Drug Use: Yes     Comment: occasional marijuana use  . Sexual Activity: Not on file   Other Topics Concern  . Not on file   Social History Narrative     Family History  Problem Relation Age of Onset  . Hypertension Mother   . Heart attack Mother 87  . Lung cancer Father     heavy smoker    BP 114/89 mmHg  Pulse 121  Temp(Src) 98.4 F (  36.9 C) (Oral)  Resp 16  Ht  (1.727 m)  Wt 158 lb 11.2 oz (71.986 kg)  BMI 24.14 kg/m2  SpO2 96%  Physical Exam: Filed Vitals:   03/29/14 0644 03/29/14 0942 03/29/14 1146 03/29/14 1404  BP: 114/89 127/87 122/85 108/67  Pulse:  125 92 103  Temp:    98.1 F (36.7 C)  TempSrc:    Oral  Resp:      Height:      Weight:      SpO2:    100%    GEN- The patient is well appearing, alert and oriented x 3 today.   Head- normocephalic, atraumatic Eyes-  Sclera clear, conjunctiva pink Ears- hearing intact Oropharynx- clear Neck- supple, Lungs- Clear to ausculation bilaterally, normal work of breathing Heart- tachycardic irregular rhythm GI- soft, NT, ND, + BS Extremities- no clubbing, cyanosis, or edema  MS- no significant deformity or atrophy Skin- no rash or lesion Psych- bizarre affect, slightly aggressive appearing, pressured speech Neuro- strength and sensation are intact    Labs:   Lab Results  Component Value Date   WBC 12.5* 03/29/2014     HGB 17.2* 03/29/2014   HCT 49.8 03/29/2014   MCV 97.1 03/29/2014   PLT 243 03/29/2014    Recent Labs Lab 03/27/14 1341 03/28/14 0800  NA 139 137  K 4.5 3.6  CL 108 105  CO2 22 24  BUN 9 7  CREATININE 1.04 0.94  CALCIUM 9.5 9.4  PROT 7.1  --   BILITOT 0.5  --   ALKPHOS 93  --   ALT 27  --   AST 25  --   GLUCOSE 119* 115*     Radiology/Studies: Dg Chest 2 View 03/27/2014   CLINICAL DATA:  Chest pain and shortness of breath.  EXAM: CHEST  2 VIEW  COMPARISON:  None currently available  FINDINGS: There is moderate aortic tortuosity for age. No cardiomegaly. The hila are normal.  Mild interstitial coarsening without edema or pneumonia. No effusion or pneumothorax.  IMPRESSION: No active cardiopulmonary disease.   Electronically Signed   By: Tiburcio Pea M.D.   On: 03/27/2014 14:38    ZOX:[id number] atrial flutter with 2:1 AV conduction, 135 bpm  TELEMETRY:  Atrial flutter  A/P 1. Typical atrial flutter The patient has symptomatic atrial flutter with RVR.  He has decompensated CHf which is likely due to his atrial flutter.  His chads2vasc score is 1.  V rates remain elevated.  He is a heavy drinker and felt to be a poor candidate long term for medical therapy.  His chance of recurrence/ decompensation is quite high.  Therapeutic strategies for atrial flutter including medicine and ablation were discussed in detail with the patient today. Risk, benefits, and alternatives to EP study and radiofrequency ablation were also discussed in detail today. These risks include but are not limited to stroke, bleeding, vascular damage, tamponade, perforation, damage to the heart and other structures, AV block requiring pacemaker, worsening renal function, and death. The patient understands these risk and wishes to proceed.  We will therefore proceed with tee guided catheter ablation of atrial flutter tomorrow as our schedule allows. He does not feel that he could do lovenox/ coumadin.  He understands  that there is a slight increased risk of stroke with eliquis in the setting of carbamazepine which is an inducer of the p glycoprotein system and therefore reduces overall effectiveness of eliquis.  He understands this risk and wishes to continue with eliquis.  2. ETOH Cessation is strongly advised  3. Acute systolic dysfunction Likely tachycardia mediated.  Will pursue sinus rhythm as above Could also be related to ETOH toxicity ETOh cessation is advised Would initiate lisinopril and coreg for medical management. Repeat echo after in sinus for 3 months  4. Tobacco Cessation advised   Hillis Range MD 03/29/2014 5:20 PM

## 2014-03-30 ENCOUNTER — Encounter (HOSPITAL_COMMUNITY): Payer: Self-pay | Admitting: *Deleted

## 2014-03-30 ENCOUNTER — Encounter (HOSPITAL_COMMUNITY)
Admission: EM | Disposition: A | Discharge status: Home / Self Care | Payer: Self-pay | Source: Home / Self Care | Attending: Internal Medicine

## 2014-03-30 DIAGNOSIS — I4892 Unspecified atrial flutter: Secondary | ICD-10-CM

## 2014-03-30 DIAGNOSIS — I483 Typical atrial flutter: Secondary | ICD-10-CM

## 2014-03-30 DIAGNOSIS — I5021 Acute systolic (congestive) heart failure: Secondary | ICD-10-CM

## 2014-03-30 HISTORY — PX: ATRIAL FLUTTER ABLATION: SHX5733

## 2014-03-30 HISTORY — PX: TEE WITHOUT CARDIOVERSION: SHX5443

## 2014-03-30 LAB — GLUCOSE, CAPILLARY: GLUCOSE-CAPILLARY: 86 mg/dL (ref 70–99)

## 2014-03-30 LAB — CBC
HCT: 49.6 % (ref 39.0–52.0)
HEMOGLOBIN: 16.6 g/dL (ref 13.0–17.0)
MCH: 33.2 pg (ref 26.0–34.0)
MCHC: 33.5 g/dL (ref 30.0–36.0)
MCV: 99.2 fL (ref 78.0–100.0)
PLATELETS: 239 10*3/uL (ref 150–400)
RBC: 5 MIL/uL (ref 4.22–5.81)
RDW: 13.2 % (ref 11.5–15.5)
WBC: 13.8 10*3/uL — ABNORMAL HIGH (ref 4.0–10.5)

## 2014-03-30 SURGERY — ECHOCARDIOGRAM, TRANSESOPHAGEAL
Anesthesia: Moderate Sedation

## 2014-03-30 SURGERY — ATRIAL FLUTTER ABLATION

## 2014-03-30 MED ORDER — FENTANYL CITRATE 0.05 MG/ML IJ SOLN
INTRAMUSCULAR | Status: DC | PRN
  Administered 2014-03-30 (×3): 25 ug via INTRAVENOUS

## 2014-03-30 MED ORDER — ONDANSETRON HCL 4 MG/2ML IJ SOLN: 4.0000 mg | Freq: Four times a day (QID) | INTRAMUSCULAR | Status: DC | PRN

## 2014-03-30 MED ORDER — MIDAZOLAM HCL 10 MG/2ML IJ SOLN
INTRAMUSCULAR | Status: DC | PRN
  Administered 2014-03-30 (×2): 2 mg via INTRAVENOUS
  Administered 2014-03-30: 1 mg via INTRAVENOUS

## 2014-03-30 MED ORDER — ACETAMINOPHEN 325 MG PO TABS: 650.0000 mg | ORAL_TABLET | ORAL | Status: DC | PRN

## 2014-03-30 MED ORDER — MIDAZOLAM HCL 5 MG/ML IJ SOLN
INTRAMUSCULAR | Status: AC
Start: 2014-03-30 — End: 2014-03-30
  Filled 2014-03-30: qty 2

## 2014-03-30 MED ORDER — SODIUM CHLORIDE 0.9 % IV SOLN: 250.0000 mL | INTRAVENOUS | Status: DC | PRN

## 2014-03-30 MED ORDER — HEPARIN (PORCINE) IN NACL 2-0.9 UNIT/ML-% IJ SOLN
INTRAMUSCULAR | Status: AC
  Filled 2014-03-30: qty 500

## 2014-03-30 MED ORDER — MIDAZOLAM HCL 5 MG/5ML IJ SOLN
INTRAMUSCULAR | Status: AC
  Filled 2014-03-30: qty 5

## 2014-03-30 MED ORDER — FENTANYL CITRATE 0.05 MG/ML IJ SOLN
INTRAMUSCULAR | Status: AC
  Filled 2014-03-30: qty 2

## 2014-03-30 MED ORDER — BUPIVACAINE HCL (PF) 0.25 % IJ SOLN
INTRAMUSCULAR | Status: AC
  Filled 2014-03-30: qty 60

## 2014-03-30 MED ORDER — BUTAMBEN-TETRACAINE-BENZOCAINE 2-2-14 % EX AERO
INHALATION_SPRAY | CUTANEOUS | Status: DC | PRN
  Administered 2014-03-30: 2 via TOPICAL

## 2014-03-30 MED ORDER — DIPHENHYDRAMINE HCL 50 MG/ML IJ SOLN
INTRAMUSCULAR | Status: DC | PRN
  Administered 2014-03-30: 25 mg via INTRAVENOUS

## 2014-03-30 MED ORDER — DIPHENHYDRAMINE HCL 50 MG/ML IJ SOLN
INTRAMUSCULAR | Status: AC
  Filled 2014-03-30: qty 1

## 2014-03-30 MED ORDER — SODIUM CHLORIDE 0.9 % IJ SOLN: 3.0000 mL | INTRAMUSCULAR | Status: DC | PRN

## 2014-03-30 MED ORDER — SODIUM CHLORIDE 0.9 % IJ SOLN
3.0000 mL | Freq: Two times a day (BID) | INTRAMUSCULAR | Status: DC
  Administered 2014-03-30 – 2014-03-31 (×2): 3 mL via INTRAVENOUS

## 2014-03-30 NOTE — Progress Notes (Signed)
Site area:  Site Prior to Removal:  Level 0 Pressure Applied For: 20 min Manual:   yes Patient Status During Pull:  vss tolerated well Post Pull Site:  Level 0 Post Pull Instructions Given:  Yes  Post Pull Pulses Present: yes Dressing Applied:  tegaderm to rt groin site Bedrest begins @ 1210 pm Comments:vss tolerated well

## 2014-03-30 NOTE — Progress Notes (Signed)
Patient Demographics  Rodney Black, is a 50 y.o. male, DOB - Aug 04, 1965, ZOX:[id number]  Admit date - 03/27/2014   Admitting Physician Maretta Bees, MD  Outpatient Primary MD for the patient is No primary care provider on file.  LOS - 3   Chief Complaint  Patient presents with  . Chest Pain  . Shortness of Breath        Subjective:   Rodney Black today has, No headache, No chest pain, No abdominal pain - No Nausea, No new weakness tingling or numbness, No Cough , no SOB. Pleasant today, returned back from cardioversion. Now in NSR.   Assessment & Plan    Principal Problem:   Atrial flutter with rapid ventricular response Active Problems:   Tobacco abuse   Alcohol dependence   A-fib  Atrial flutter with RVR Etiology unclear; alcohol and tobacco use probably involved. Heart rate is 130-140 on admission. Patient was given diltiazem in the ED. Consult cardiology. Cycle cardiac enzymes, check BNP, urine drug screen, and obtain 2D echocardiogram. Telemetry, daily weights, and prn oxygen therapy. Continue diltiazem.CHADS2VASC SCORE IS 1. Was on cardizem drip, transitioned to oral cardizem on 2/3. On apixaban for now, significant drug interaction with tegretol. In discussion with cardiology/pharmacy for better choice of anti coagualation, possible cardioversion this admission per cardiology. Cards/EP s/p successful  Cardioversion on 2/5.  Duration and modality of anticoagulation per cardiology recommendation. ( difficult choice due to drug interaction with psych meds and compliance issue) Will need case management to help assist meds/transportation/insurance application.  Exertional dyspnea: Likely from chronic aflutter/afib. Lung clears on exam, awaiting echo result. D/c lasix on 2/3. Show whether this is from ?COPD (long-standing history of tobacco abuse) vs CHF (alcohol)-his lungs are  currently clear, he has no obvious signs of volume overload at this time. Check echo to assess systolic function. May need outpatient PFTs. Echo showed LVEF 25%, coreg/lsinopril started, will need repeat echo in 3months. Alcohol/tabacco cessation.  Tobacco abuse Patient has smoked 1.5 ppd x 30 years. He honestly admits he doesn't want to quit or has never really considered it, but knows he probably needs to quit. Give nicotine patches during hospital stay. smoking cessation education provided  Alcohol dependence Patient drinks 2 forty ounce beers a day. Ordered CIWA assessments, thiamine, and folate. Ativan prn.  Leukocytosis: no fever, cxr unremarkable, ua pending, nontoxic. From dehydration?/stress encourage oral intake. ua unremarkable.  Psych: very anxious, requiring frequent reassurance. Continue home meds tegretol/lithium, patient report has been on these for 31yrs.  Code Status: DNR  Family Communication: patient  Disposition Plan: remain in patient, likely will need cardioversion during this admission   Procedures echo/cardioversion   Consults  Cardiology/ep   Medications  Scheduled Meds: . apixaban  5 mg Oral BID  . carbamazepine  400 mg Oral QHS  . carvedilol  6.25 mg Oral BID WC  . diltiazem  240 mg Oral Daily  . folic acid  1 mg Oral Daily  . lisinopril  5 mg Oral Daily  . lithium carbonate  1,200 mg Oral QHS  . multivitamin with minerals  1 tablet Oral Daily  . nicotine  21 mg Transdermal Daily  . off the beat book   Does not apply Once  .  potassium chloride  20 mEq Oral BID  . sodium chloride  3 mL Intravenous Q12H  . sodium chloride  3 mL Intravenous Q12H  . sodium chloride  3 mL Intravenous Q12H  . thiamine  100 mg Oral Daily   Or  . thiamine  100 mg Intravenous Daily   Continuous Infusions: . sodium chloride     PRN Meds:.sodium chloride, acetaminophen **OR** acetaminophen, acetaminophen, alum & mag hydroxide-simeth, LORazepam **OR** LORazepam,  ondansetron **OR** ondansetron (ZOFRAN) IV, ondansetron (ZOFRAN) IV, senna-docusate, sodium chloride, sodium chloride  DVT Prophylaxis  On apixaban  Lab Results  Component Value Date   PLT 239 03/30/2014    Antibiotics  none  Anti-infectives    None          Objective:   Filed Vitals:   03/30/14 0930 03/30/14 0935 03/30/14 0940 03/30/14 1105  BP: 126/104 129/91    Pulse: 118 115 114 98  Temp:      TempSrc:      Resp: 23 21 16 13   Height:      Weight:      SpO2: 97% 96% 94% 100%    Wt Readings from Last 3 Encounters:  03/30/14 70.8 kg (156 lb 1.4 oz)     Intake/Output Summary (Last 24 hours) at 03/30/14 1422 Last data filed at 03/30/14 0950  Gross per 24 hour  Intake    980 ml  Output    400 ml  Net    580 ml     Physical Exam  Awake Alert, Oriented X 3, No new F.N deficits, very anxious [place].AT,PERRAL Supple Neck,No JVD, No cervical lymphadenopathy appriciated.  Symmetrical Chest wall movement, Good air movement bilaterally, CTAB In aflutter,No Gallops,Rubs or new Murmurs, No Parasternal Heave +ve B.Sounds, Abd Soft, No tenderness, No organomegaly appriciated, No rebound - guarding or rigidity. No Cyanosis, Clubbing or edema, No new Rash or bruise     Data Review   Micro Results No results found for this or any previous visit (from the past 240 hour(s)).  Radiology Reports Dg Chest 2 View  03/27/2014   CLINICAL DATA:  Chest pain and shortness of breath.  EXAM: CHEST  2 VIEW  COMPARISON:  None currently available  FINDINGS: There is moderate aortic tortuosity for age. No cardiomegaly. The hila are normal.  Mild interstitial coarsening without edema or pneumonia. No effusion or pneumothorax.  IMPRESSION: No active cardiopulmonary disease.   Electronically Signed   By: Tiburcio Pea M.D.   On: 03/27/2014 14:38     CBC  Recent Labs Lab 03/27/14 1341 03/28/14 0107 03/29/14 0541 03/30/14 0404  WBC 9.5 12.4* 12.5* 13.8*  HGB 17.8* 17.5* 17.2* 16.6    HCT 51.2 50.1 49.8 49.6  PLT 267 245 243 239  MCV 97.2 97.9 97.1 99.2  MCH 33.8 34.2* 33.5 33.2  MCHC 34.8 34.9 34.5 33.5  RDW 13.4 13.2 13.1 13.2  LYMPHSABS 2.6  --   --   --   MONOABS 0.5  --   --   --   EOSABS 0.2  --   --   --   BASOSABS 0.1  --   --   --     Chemistries   Recent Labs Lab 03/27/14 1341 03/27/14 1417 03/28/14 0800  NA 139  --  137  K 4.5  --  3.6  CL 108  --  105  CO2 22  --  24  GLUCOSE 119*  --  115*  BUN 9  --  7  CREATININE 1.04  --  0.94  CALCIUM 9.5  --  9.4  MG  --  2.0  --   AST 25  --   --   ALT 27  --   --   ALKPHOS 93  --   --   BILITOT 0.5  --   --    ------------------------------------------------------------------------------------------------------------------ estimated creatinine clearance is 93 mL/min (by C-G formula based on Cr of 0.94). ------------------------------------------------------------------------------------------------------------------  Recent Labs  03/27/14 2237  HGBA1C 5.7*   ------------------------------------------------------------------------------------------------------------------ No results for input(s): CHOL, HDL, LDLCALC, TRIG, CHOLHDL, LDLDIRECT in the last 72 hours. ------------------------------------------------------------------------------------------------------------------ No results for input(s): TSH, T4TOTAL, T3FREE, THYROIDAB in the last 72 hours.  Invalid input(s): FREET3 ------------------------------------------------------------------------------------------------------------------ No results for input(s): VITAMINB12, FOLATE, FERRITIN, TIBC, IRON, RETICCTPCT in the last 72 hours.  Coagulation profile  Recent Labs Lab 03/27/14 1417  INR 1.01    No results for input(s): DDIMER in the last 72 hours.  Cardiac Enzymes  Recent Labs Lab 03/27/14 2109 03/28/14 0107 03/28/14 0800  TROPONINI <0.03 <0.03 0.03    ------------------------------------------------------------------------------------------------------------------ Invalid input(s): POCBNP        Gentle Hoge M.D on 03/30/2014 at 2:22 PM  Between 7am to 7pm - Pager - [phone]  After 7pm go to [email or web address] - password [facility]  [facility] Office  [phone]

## 2014-03-30 NOTE — Discharge Instructions (Signed)
Information on my medicine - ELIQUIS® (apixaban) ° °This medication education was reviewed with me or my healthcare representative as part of my discharge preparation.  The pharmacist that spoke with me during my hospital stay was:  Palmina Clodfelter David, RPH ° °Why was Eliquis® prescribed for you? °Eliquis® was prescribed for you to reduce the risk of forming blood clots that can cause a stroke if you have a medical condition called atrial fibrillation (a type of irregular heartbeat) OR to reduce the risk of a blood clots forming after orthopedic surgery. ° °What do You need to know about Eliquis® ? °Take your Eliquis® TWICE DAILY - one tablet in the morning and one tablet in the evening with or without food.  It would be best to take the doses about the same time each day. ° °If you have difficulty swallowing the tablet whole please discuss with your pharmacist how to take the medication safely. ° °Take Eliquis® exactly as prescribed by your doctor and DO NOT stop taking Eliquis® without talking to the doctor who prescribed the medication.  Stopping may increase your risk of developing a new clot or stroke.  Refill your prescription before you run out. ° °After discharge, you should have regular check-up appointments with your healthcare provider that is prescribing your Eliquis®.  In the future your dose may need to be changed if your kidney function or weight changes by a significant amount or as you get older. ° °What do you do if you miss a dose? °If you miss a dose, take it as soon as you remember on the same day and resume taking twice daily.  Do not take more than one dose of ELIQUIS at the same time. ° °Important Safety Information °A possible side effect of Eliquis® is bleeding. You should call your healthcare provider right away if you experience any of the following: °? Bleeding from an injury or your nose that does not stop. °? Unusual colored urine (red or dark brown) or unusual colored stools (red or  black). °? Unusual bruising for unknown reasons. °? A serious fall or if you hit your head (even if there is no bleeding). ° °Some medicines may interact with Eliquis® and might increase your risk of bleeding or clotting while on Eliquis®. To help avoid this, consult your healthcare provider or pharmacist prior to using any new prescription or non-prescription medications, including herbals, vitamins, non-steroidal anti-inflammatory drugs (NSAIDs) and supplements. ° °This website has more information on Eliquis® (apixaban): www.Eliquis.com. ° ° ° °

## 2014-03-30 NOTE — Progress Notes (Signed)
Pt's bp 86/58 manually. Pt asymptomatic. Will continue to monitor the pt. Sanda Linger, RN

## 2014-03-30 NOTE — Progress Notes (Signed)
  Echocardiogram Echocardiogram Transesophageal has been performed.  Rodney Black FRANCES 03/30/2014, 9:59 AM

## 2014-03-30 NOTE — CV Procedure (Signed)
Procedure: TEE  Indication: Atrial flutter, pre-ablation  Sedation: Versed 5 mg IV, Fentanyl 75 mcg IV, Benadryl 25 mg IV  Findings: Please see echo section for full report.  The patient was in atrial flutter.  Normal LV size with severe global hypokinesis, EF 20-25%.  Normal RV size with moderately decreased systolic function.  Trivial MR.  The aortic valve was bicuspid but there was no aortic stenosis or regurgitation.  Normal caliber aorta with no significant plaque.  Mildly dilated left atrium with no LAA thrombus.  No ASD or PFO (negative bubble study).   May proceed to ablation.   Marca Ancona 03/30/2014 9:56 AM

## 2014-03-30 NOTE — Interval H&P Note (Signed)
History and Physical Interval Note:  03/30/2014 9:21 AM  Rodney Black  has presented today for surgery, with the diagnosis of AFIB  The various methods of treatment have been discussed with the patient and family. After consideration of risks, benefits and other options for treatment, the patient has consented to  Procedure(s): TRANSESOPHAGEAL ECHOCARDIOGRAM (TEE) (N/A) as a surgical intervention .  The patient's history has been reviewed, patient examined, no change in status, stable for surgery.  I have reviewed the patient's chart and labs.  Questions were answered to the patient's satisfaction.     [name] [employer]

## 2014-03-30 NOTE — Progress Notes (Signed)
Patient Profile:  49 y.o. male with a past medical history significant for manic depression/bipolar disorder and ETOH abuse who presented to the ER on 03-27-14 with a 2 month history of progressive fatigue and dyspnea on exertion. He was found to be in atrial flutter with RVR. Echocardiogram demonstrated EF of 25%. Now on Eliquis for stroke prophylaxis.   Subjective: Still feels very fatigue. No dyspnea at rest. Denies palpitations.   Objective: Vital signs in last 24 hours: Temp:  [97.8 F (36.6 C)-98.4 F (36.9 C)] 97.8 F (36.6 C) (02/05 0550) Pulse Rate:  [64-125] 64 (02/05 0550) Resp:  [16-18] 18 (02/05 0550) BP: (86-127)/(58-95) 86/58 mmHg (02/05 0558) SpO2:  [96 %-100 %] 96 % (02/05 0550) Weight:  [156 lb 1.4 oz (70.8 kg)] 156 lb 1.4 oz (70.8 kg) (02/05 0550) Last BM Date: 03/29/14  Intake/Output from previous day: 02/04 0701 - 02/05 0700 In: 963 [P.O.:960; I.V.:3] Out: 402 [Urine:401; Stool:1] Intake/Output this shift:    Medications Current Facility-Administered Medications  Medication Dose Route Frequency Provider Last Rate Last Dose  . 0.9 %  sodium chloride infusion   Intravenous Continuous Azalee Course, PA 20 mL/hr at 03/29/14 1825    . 0.9 %  sodium chloride infusion  250 mL Intravenous Continuous Azalee Course, PA   0 mL at 03/29/14 1830  . 0.9 %  sodium chloride infusion   Intravenous Continuous Hillis Range, MD 20 mL/hr at 03/30/14 0001    . acetaminophen (TYLENOL) tablet 650 mg  650 mg Oral Q6H PRN Stephani Police, PA-C       Or  . acetaminophen (TYLENOL) suppository 650 mg  650 mg Rectal Q6H PRN Stephani Police, PA-C      . alum & mag hydroxide-simeth (MAALOX/MYLANTA) 200-200-20 MG/5ML suspension 30 mL  30 mL Oral Q6H PRN Stephani Police, PA-C      . apixaban (ELIQUIS) tablet 5 mg  5 mg Oral BID Azalee Course, PA   5 mg at 03/29/14 2146  . carbamazepine (TEGRETOL) tablet 400 mg  400 mg Oral QHS Maretta Bees, MD   400 mg at 03/29/14 2146  . carvedilol (COREG) tablet  6.25 mg  6.25 mg Oral BID WC Hillis Range, MD   6.25 mg at 03/29/14 1822  . diltiazem (CARDIZEM CD) 24 hr capsule 240 mg  240 mg Oral Daily Azalee Course, [place]   240 mg at 03/29/14 0942  . folic acid (FOLVITE) tablet 1 mg  1 mg Oral Daily Stephani Police, PA-C   1 mg at 03/29/14 2440  . lisinopril (PRINIVIL,ZESTRIL) tablet 5 mg  5 mg Oral Daily Hillis Range, MD   5 mg at 03/29/14 1824  . lithium carbonate capsule 1,200 mg  1,200 mg Oral QHS Maretta Bees, MD   1,200 mg at 03/29/14 2151  . LORazepam (ATIVAN) tablet 1 mg  1 mg Oral Q6H PRN Stephani Police, PA-C   1 mg at 03/30/14 0002   Or  . LORazepam (ATIVAN) injection 1 mg  1 mg Intravenous Q6H PRN Stephani Police, PA-C   1 mg at 03/29/14 1027  . multivitamin with minerals tablet 1 tablet  1 tablet Oral Daily Stephani Police, PA-C   1 tablet at 03/29/14 2536  . nicotine (NICODERM CQ - dosed in mg/24 hours) patch 21 mg  21 mg Transdermal Daily Albertine Grates, MD   21 mg at 03/29/14 0948  . off the beat book   Does not apply Once Albertine Grates,  MD      . ondansetron (ZOFRAN) tablet 4 mg  4 mg Oral Q6H PRN Stephani Police, PA-C   4 mg at 03/30/14 0004   Or  . ondansetron [facility]) injection 4 mg  4 mg Intravenous Q6H PRN Stephani Police, PA-C      . potassium chloride SA (K-DUR,KLOR-CON) CR tablet 20 mEq  20 mEq Oral BID Vesta Mixer, MD   20 mEq at 03/29/14 2146  . senna-docusate (Senokot-S) tablet 1 tablet  1 tablet Oral QHS PRN Stephani Police, PA-C      . sodium chloride 0.9 % injection 3 mL  3 mL Intravenous Q12H Stephani Police, PA-C   3 mL at 03/29/14 2147  . sodium chloride 0.9 % injection 3 mL  3 mL Intravenous Q12H Azalee Course, PA   3 mL at 03/29/14 1826  . sodium chloride 0.9 % injection 3 mL  3 mL Intravenous PRN Azalee Course, PA      . thiamine (VITAMIN B-1) tablet 100 mg  100 mg Oral Daily Stephani Police, PA-C   100 mg at 03/29/14 1117   Or  . thiamine (B-1) injection 100 mg  100 mg Intravenous Daily Stephani Police, PA-C        PE: General  appearance: alert, cooperative and no distress Neck: no carotid bruit and no JVD Lungs: clear to auscultation bilaterally Heart: irregular rhythm + tachy rate Extremities: no LEE Pulses: 2+ and symmetric Skin: warm and dry Neurologic: Grossly normal  Lab Results:   Recent Labs  03/28/14 0107 03/29/14 0541 03/30/14 0404  WBC 12.4* 12.5* 13.8*  HGB 17.5* 17.2* 16.6  HCT 50.1 49.8 49.6  PLT 245 243 239   BMET  Recent Labs  03/27/14 1341 03/28/14 0800  NA 139 137  K 4.5 3.6  CL 108 105  CO2 22 24  GLUCOSE 119* 115*  BUN 9 7  CREATININE 1.04 0.94  CALCIUM 9.5 9.4   PT/INR  Recent Labs  03/27/14 1417  LABPROT 13.4  INR 1.01   Assessment/Plan  Principal Problem:   Atrial flutter with rapid ventricular response Active Problems:   Tobacco abuse   Alcohol dependence   A-fib  1. Typical atrial flutter: Telemetry continues to show atrial flutter with a ventricular response in the 100-110s. He has been very symptomatic. He also has decompensated CHF which is likely due to his atrial flutter. Decision was reached yesterday to proceed with TEE guided catheter ablation. Tentatively scheduled for today with Dr. [name]. Will need to continue anticoagulation. Will defer to MD continuation of Eliquis as coadministration with Carbamazepine increases plasma concentrations and pharmacologic effects of Eliquis.   2. Acute Systolic CHF: likely tachycardia mediated, but ETOH use may also be a contributing factor. Low dose Coreg and lisinopril were both initiated yesterday. BP has been soft. Recommend holding both agents for SBP <90. If ablation is successful, can likely discontinue PO Cardizem, which will allow more BP room to resume HF meds. Discontinuation of Cardizem will also be beneficial long term for systolic function. Will recheck 2D echo in 3 months.   3. ETOH Abuse: cessation strongly advised.     LOS: 3 days    Brittainy M. Delmer Islam 03/30/2014 7:43 AM  EP  Attending  Patient seen and examined. Agree with above.  Leonia Reeves.D.

## 2014-03-30 NOTE — H&P (View-Only) (Signed)
 ELECTROPHYSIOLOGY CONSULT NOTE    Patient ID: Rodney Black MRN: [id number], DOB/AGE: 05/31/1965 49 y.o.  Admit date: 03/27/2014 Date of Consult: 03/29/2014  Primary Cardiologist: new to [facility]  Reason for Consultation: atrial flutter  HPI:  Rodney Black is a 49 y.o. male with a past medical history significant for manic depression/bipolar disorder who has not had medical care in several years.  He presented to the ER on 03-27-14 with a 2 month history of progressive fatigue and dyspnea on exertion.  He was found to be in atrial flutter with RVR.  Echocardiogram demonstrated EF of 25%.  EP has been asked to evaluate for treatment options.  He has been felt to be a poor candidate for lovenox/ coumadin.  He has been started on eliquis.  Presently, he is resting comfortably and is without complaint.  Past Medical History  Diagnosis Date  . Manic depression   . Bipolar 1 disorder   . Tobacco abuse   . Alcohol abuse   . Atrial flutter with rapid ventricular response 03/27/2014    unknown duration, newly diagnosed 03/2014     Surgical History:  Past Surgical History  Procedure Laterality Date  . Arm fracture      when patient was young     Prescriptions prior to admission  Medication Sig Dispense Refill Last Dose  . aspirin 325 MG tablet Take 325 mg by mouth daily.   Past Week at Unknown time  . carbamazepine (TEGRETOL) 200 MG tablet Take 400 mg by mouth at bedtime.   03/26/2014 at Unknown time  . lithium carbonate 300 MG capsule Take 1,200 mg by mouth at bedtime.   03/26/2014 at Unknown time    Inpatient Medications:  . apixaban  5 mg Oral BID  . carbamazepine  400 mg Oral QHS  . diltiazem  240 mg Oral Daily  . folic acid  1 mg Oral Daily  . lithium carbonate  1,200 mg Oral QHS  . metoprolol tartrate  25 mg Oral BID  . multivitamin with minerals  1 tablet Oral Daily  . nicotine  21 mg Transdermal Daily  . off the beat book   Does not apply Once  . potassium chloride   20 mEq Oral BID  . sodium chloride  3 mL Intravenous Q12H  . thiamine  100 mg Oral Daily   Or  . thiamine  100 mg Intravenous Daily    Allergies: No Known Allergies  History   Social History  . Marital Status: Single    Spouse Name: N/A    Number of Children: N/A  . Years of Education: N/A   Occupational History  . janitor    Social History Main Topics  . Smoking status: Current Every Day Smoker  . Smokeless tobacco: Not on file     Comment: 1.5 pack per day since high school  . Alcohol Use: 0.0 oz/week    0 Not specified per week     Comment: 2x 40 oz beers daily  . Drug Use: Yes     Comment: occasional marijuana use  . Sexual Activity: Not on file   Other Topics Concern  . Not on file   Social History Narrative     Family History  Problem Relation Age of Onset  . Hypertension Mother   . Heart attack Mother 57  . Lung cancer Father     heavy smoker    BP 114/89 mmHg  Pulse 121  Temp(Src) 98.4 F (  36.9 C) (Oral)  Resp 16  Ht 5' 8" (1.727 m)  Wt 158 lb 11.2 oz (71.986 kg)  BMI 24.14 kg/m2  SpO2 96%  Physical Exam: Filed Vitals:   03/29/14 0644 03/29/14 0942 03/29/14 1146 03/29/14 1404  BP: 114/89 127/87 122/85 108/67  Pulse:  125 92 103  Temp:    98.1 F (36.7 C)  TempSrc:    Oral  Resp:      Height:      Weight:      SpO2:    100%    GEN- The patient is well appearing, alert and oriented x 3 today.   Head- normocephalic, atraumatic Eyes-  Sclera clear, conjunctiva pink Ears- hearing intact Oropharynx- clear Neck- supple, Lungs- Clear to ausculation bilaterally, normal work of breathing Heart- tachycardic irregular rhythm GI- soft, NT, ND, + BS Extremities- no clubbing, cyanosis, or edema  MS- no significant deformity or atrophy Skin- no rash or lesion Psych- bizarre affect, slightly aggressive appearing, pressured speech Neuro- strength and sensation are intact    Labs:   Lab Results  Component Value Date   WBC 12.5* 03/29/2014     HGB 17.2* 03/29/2014   HCT 49.8 03/29/2014   MCV 97.1 03/29/2014   PLT 243 03/29/2014    Recent Labs Lab 03/27/14 1341 03/28/14 0800  NA 139 137  K 4.5 3.6  CL 108 105  CO2 22 24  BUN 9 7  CREATININE 1.04 0.94  CALCIUM 9.5 9.4  PROT 7.1  --   BILITOT 0.5  --   ALKPHOS 93  --   ALT 27  --   AST 25  --   GLUCOSE 119* 115*     Radiology/Studies: Dg Chest 2 View 03/27/2014   CLINICAL DATA:  Chest pain and shortness of breath.  EXAM: CHEST  2 VIEW  COMPARISON:  None currently available  FINDINGS: There is moderate aortic tortuosity for age. No cardiomegaly. The hila are normal.  Mild interstitial coarsening without edema or pneumonia. No effusion or pneumothorax.  IMPRESSION: No active cardiopulmonary disease.   Electronically Signed   By: Jonathan  Watts M.D.   On: 03/27/2014 14:38    EKG:typical atrial flutter with 2:1 AV conduction, 135 bpm  TELEMETRY:  Atrial flutter  A/P 1. Typical atrial flutter The patient has symptomatic atrial flutter with RVR.  He has decompensated CHf which is likely due to his atrial flutter.  His chads2vasc score is 1.  V rates remain elevated.  He is a heavy drinker and felt to be a poor candidate long term for medical therapy.  His chance of recurrence/ decompensation is quite high.  Therapeutic strategies for atrial flutter including medicine and ablation were discussed in detail with the patient today. Risk, benefits, and alternatives to EP study and radiofrequency ablation were also discussed in detail today. These risks include but are not limited to stroke, bleeding, vascular damage, tamponade, perforation, damage to the heart and other structures, AV block requiring pacemaker, worsening renal function, and death. The patient understands these risk and wishes to proceed.  We will therefore proceed with tee guided catheter ablation of atrial flutter tomorrow as our schedule allows. He does not feel that he could do lovenox/ coumadin.  He understands  that there is a slight increased risk of stroke with eliquis in the setting of carbamazepine which is an inducer of the p glycoprotein system and therefore reduces overall effectiveness of eliquis.  He understands this risk and wishes to continue with eliquis.      2. ETOH Cessation is strongly advised  3. Acute systolic dysfunction Likely tachycardia mediated.  Will pursue sinus rhythm as above Could also be related to ETOH toxicity ETOh cessation is advised Would initiate lisinopril and coreg for medical management. Repeat echo after in sinus for 3 months  4. Tobacco Cessation advised   Jisele Price MD 03/29/2014 5:20 PM   

## 2014-03-30 NOTE — CV Procedure (Signed)
SURGEON: Lewayne Bunting, MD   PREPROCEDURE DIAGNOSIS: Typical Atrial flutter.   POSTPROCEDURE DIAGNOSIS: Typical Atrial flutter.   PROCEDURES:  1. Comprehensive electrophysiology study.  2. Coronary sinus pacing and recording.  3. Mapping of atrial flutter.  5. Ablation of atrial flutter.  6. Arrhythmia induction with pacing.   INTRODUCTION: Rodney Black is a 49 y.o. male with symptomatic typical atrial flutter. He presents today for EP study and radiofrequency ablation.   DESCRIPTION OF PROCEDURE: Informed written consent was obtained, and the patient was brought to the Electrophysiology Lab in the fasting state. He was sedated with IV Versed and Fentanyl. The patient's right groin and neck was prepped and draped in the usual sterile fashion by the EP lab staff. Using a modified Seldinger technique, one 6, and two 8-French hemostasis sheaths were placed in the right common femoral vein. A 6-French hexapolar catheter was placed into the coronary sinus via the right femoral vein. A 6-French quadripolar Josephson catheter was introduced through the right common femoral vein and advanced into the right ventricle for recording and pacing, but this catheter was then pulled back to the His bundle location.   Initial Measurements: The patient presented to the electrophysiology lab in atrial flutter. The surface electrocardiogram was consistent with typical atrial flutter.  The atrial flutter cycle length was 256 milliseconds.  The coronary sinus catheter activation revealed proximal to distal activation and was therefore suggestive of right atrial flutter.  The patient's QRS duration was 118 milliseconds with a QT interval of 400 milliseconds and an HV interval of 33 milliseconds.   Entrainment and Mapping: Entrainment was performed from the left atrium, which revealed a long postpacing interval.  A 7 Fr 8-mm ablation catheter was introduced through the right common femoral vein and advanced into  the right atrium.  The catheter was positioned along the cavotricuspid isthmus.  Entrainment mapping was performed from the cavotricuspid isthmus.  The postpacing interval was equal to the tachycardia cycle length when pacing in this location during entrainment.  The patient was therefore felt to have isthmus-dependent right atrial flutter.  Mapping was performed along the atrial side of the cavotricuspid isthmus.  This demonstrated a moderate-sized isthmus.     Ablation:  The ablation catheter was therefore positioned along the cavotricuspid isthmus and a series of radiofrequency applications were delivered with a target temperature of 60 degrees of 50 watts for 120 seconds each.  The tachycardia slowed and then terminated during radiofrequency application.  Additional mapping of the atrial signal was performed with  additional ablation performed.  Following bonus radiofrequency applications, complete bidirectional isthmus block was achieved as evident by differential atrial pacing from the low lateral right atrium.  The patient was observed for 20 minutes without return of conduction through the cavotricuspid isthmus.    Measurements Post ablation: Following ablation, the stimulus to earliest atrial activation recorded across the isthmus line measured 160 milliseconds.  The AH interval measured 170 milliseconds with an HV interval of 20 milliseconds.  Atrial pacing was performed, which revealed decremental AV conduction with no evidence of PR greater than RR.  The AV Wenckebach cycle length was 340 milliseconds.  Atrial pacing was continued down to a cycle length of 300 milliseconds with no arrhythmias induced.  Ventricular pacing was performed, which revealed midline decremental VA conduction with a retrograde VA block of 600 milliseconds.  No arrhythmias were induced. The procedure was therefore considered completed.  All catheters were removed and the sheaths were aspirated and flushed.  The sheaths were  removed and hemostasis was assured.  There were no early apparent complications.    CONCLUSIONS:  1. Isthmus-dependent right atrial flutter upon presentation.  2. Successful radiofrequency ablation of atrial flutter along the cavotricuspid isthmus with complete bidirectional isthmus block achieved.  3. No inducible arrhythmias following ablation.  4. No early apparent complications.   Lewayne Bunting, MD  9:46 AM 03/30/2014

## 2014-03-30 NOTE — H&P (Signed)
Patient ID: Rodney Black MRN: [id number], DOB/AGE: October 20, 1965 49 y.o.  Admit date: 03/27/2014 Date of Consult: 03/29/2014  Primary Cardiologist: new to [facility]  Reason for Consultation: atrial flutter  HPI:  Rodney Black is a 49 y.o. male with a past medical history significant for manic depression/bipolar disorder who has not had medical care in several years. He presented to the ER on 03-27-14 with a 2 month history of progressive fatigue and dyspnea on exertion. He was found to be in atrial flutter with RVR. Echocardiogram demonstrated EF of 25%. EP has been asked to evaluate for treatment options. He has been felt to be a poor candidate for lovenox/ coumadin. He has been started on eliquis. Presently, he is resting comfortably and is without complaint.  Past Medical History  Diagnosis Date  . Manic depression   . Bipolar 1 disorder   . Tobacco abuse   . Alcohol abuse   . Atrial flutter with rapid ventricular response 03/27/2014    unknown duration, newly diagnosed 03/2014     Surgical History:  Past Surgical History  Procedure Laterality Date  . Arm fracture      when patient was young     Prescriptions prior to admission  Medication Sig Dispense Refill Last Dose  . aspirin 325 MG tablet Take 325 mg by mouth daily.   Past Week at Unknown time  . carbamazepine (TEGRETOL) 200 MG tablet Take 400 mg by mouth at bedtime.   03/26/2014 at Unknown time  . lithium carbonate 300 MG capsule Take 1,200 mg by mouth at bedtime.   03/26/2014 at Unknown time    Inpatient Medications:  . apixaban 5 mg Oral BID  . carbamazepine 400 mg Oral QHS  . diltiazem 240 mg Oral Daily  . folic acid 1 mg Oral Daily  . lithium carbonate 1,200 mg Oral QHS  . metoprolol tartrate 25 mg Oral BID  . multivitamin with minerals 1 tablet Oral Daily  . nicotine 21 mg Transdermal Daily  .  off the beat book  Does not apply Once  . potassium chloride 20 mEq Oral BID  . sodium chloride 3 mL Intravenous Q12H  . thiamine 100 mg Oral Daily   Or  . thiamine 100 mg Intravenous Daily    Allergies: No Known Allergies  History   Social History  . Marital Status: Single    Spouse Name: N/A    Number of Children: N/A  . Years of Education: N/A   Occupational History  . janitor    Social History Main Topics  . Smoking status: Current Every Day Smoker  . Smokeless tobacco: Not on file     Comment: 1.5 pack per day since high school  . Alcohol Use: 0.0 oz/week    0 Not specified per week     Comment: 2x 40 oz beers daily  . Drug Use: Yes     Comment: occasional marijuana use  . Sexual Activity: Not on file   Other Topics Concern  . Not on file   Social History Narrative     Family History  Problem Relation Age of Onset  . Hypertension Mother   . Heart attack Mother 65  . Lung cancer Father     heavy smoker    BP 114/89 mmHg  Pulse 121  Temp(Src) 98.4 F (36.9 C) (Oral)  Resp 16  Ht  (1.727 m)  Wt 158 lb 11.2 oz (71.986 kg)  BMI 24.14 kg/m2  SpO2 96%  Physical Exam: Filed Vitals:   03/29/14 0644 03/29/14 0942 03/29/14 1146 03/29/14 1404  BP: 114/89 127/87 122/85 108/67  Pulse:  125 92 103  Temp:    98.1 F (36.7 C)  TempSrc:    Oral  Resp:      Height:      Weight:      SpO2:    100%    GEN- The patient is well appearing, alert and oriented x 3 today.  Head- normocephalic, atraumatic Eyes- Sclera clear, conjunctiva pink Ears- hearing intact Oropharynx- clear Neck- supple, Lungs- Clear to ausculation bilaterally, normal work of breathing Heart- tachycardic irregular rhythm GI- soft, NT, ND, + BS Extremities- no clubbing, cyanosis, or edema  MS- no significant deformity  or atrophy Skin- no rash or lesion Psych- bizarre affect, slightly aggressive appearing, pressured speech Neuro- strength and sensation are intact    Labs:  Lab Results  Component Value Date   WBC 12.5* 03/29/2014   HGB 17.2* 03/29/2014   HCT 49.8 03/29/2014   MCV 97.1 03/29/2014   PLT 243 03/29/2014    Recent Labs Lab 03/27/14 1341 03/28/14 0800  NA 139 137  K 4.5 3.6  CL 108 105  CO2 22 24  BUN 9 7  CREATININE 1.04 0.94  CALCIUM 9.5 9.4  PROT 7.1 --   BILITOT 0.5 --   ALKPHOS 93 --   ALT 27 --   AST 25 --   GLUCOSE 119* 115*    Radiology/Studies: Dg Chest 2 View 03/27/2014 CLINICAL DATA: Chest pain and shortness of breath. EXAM: CHEST 2 VIEW COMPARISON: None currently available FINDINGS: There is moderate aortic tortuosity for age. No cardiomegaly. The hila are normal. Mild interstitial coarsening without edema or pneumonia. No effusion or pneumothorax. IMPRESSION: No active cardiopulmonary disease. Electronically Signed By: Tiburcio Pea M.D. On: 03/27/2014 14:38    ZYY:[id number] atrial flutter with 2:1 AV conduction, 135 bpm  TELEMETRY: Atrial flutter  A/P 1. Typical atrial flutter The patient has symptomatic atrial flutter with RVR. He has decompensated CHf which is likely due to his atrial flutter. His chads2vasc score is 1. V rates remain elevated. He is a heavy drinker and felt to be a poor candidate long term for medical therapy. His chance of recurrence/ decompensation is quite high. Therapeutic strategies for atrial flutter including medicine and ablation were discussed in detail with the patient today. Risk, benefits, and alternatives to EP study and radiofrequency ablation were also discussed in detail today. These risks include but are not limited to stroke, bleeding, vascular damage, tamponade, perforation, damage to the heart and other structures, AV block  requiring pacemaker, worsening renal function, and death. The patient understands these risk and wishes to proceed. We will therefore proceed with tee guided catheter ablation of atrial flutter tomorrow as our schedule allows. He does not feel that he could do lovenox/ coumadin. He understands that there is a slight increased risk of stroke with eliquis in the setting of carbamazepine which is an inducer of the p glycoprotein system and therefore reduces overall effectiveness of eliquis. He understands this risk and wishes to continue with eliquis.   2. ETOH Cessation is strongly advised  3. Acute systolic dysfunction Likely tachycardia mediated. Will pursue sinus rhythm as above Could also be related to ETOH toxicity ETOh cessation is advised Would initiate lisinopril and coreg for medical management. Repeat echo after in sinus for 3 months  4. Tobacco Cessation advised   Leonia Reeves.D.

## 2014-03-31 ENCOUNTER — Encounter (HOSPITAL_COMMUNITY): Payer: Self-pay | Admitting: Internal Medicine

## 2014-03-31 LAB — CBC
HCT: 45.3 % (ref 39.0–52.0)
HEMOGLOBIN: 15.3 g/dL (ref 13.0–17.0)
MCH: 33.8 pg (ref 26.0–34.0)
MCHC: 33.8 g/dL (ref 30.0–36.0)
MCV: 100 fL (ref 78.0–100.0)
Platelets: 273 10*3/uL (ref 150–400)
RBC: 4.53 MIL/uL (ref 4.22–5.81)
RDW: 13.1 % (ref 11.5–15.5)
WBC: 16.5 10*3/uL — ABNORMAL HIGH (ref 4.0–10.5)

## 2014-03-31 MED ORDER — NICOTINE 21 MG/24HR TD PT24: MEDICATED_PATCH | TRANSDERMAL | Status: DC

## 2014-03-31 MED ORDER — NICOTINE 21 MG/24HR TD PT24: 21.0000 mg | MEDICATED_PATCH | Freq: Every day | TRANSDERMAL | Status: DC

## 2014-03-31 MED ORDER — LISINOPRIL 2.5 MG PO TABS: 2.5000 mg | ORAL_TABLET | Freq: Every day | ORAL | Status: DC

## 2014-03-31 MED ORDER — APIXABAN 5 MG PO TABS: 5.0000 mg | ORAL_TABLET | Freq: Two times a day (BID) | ORAL | Status: DC

## 2014-03-31 MED ORDER — CARVEDILOL 3.125 MG PO TABS: 3.1250 mg | ORAL_TABLET | Freq: Two times a day (BID) | ORAL | Status: DC

## 2014-03-31 NOTE — Discharge Summary (Signed)
Discharge Summary  Rodney Black RNH:[id number] DOB: 1965-08-24  PCP: No primary care provider on file.  Admit date: 03/27/2014 Discharge date: 03/31/2014  Time spent: less than  Recommendations for Outpatient Follow-up:  1. F/u with cardiology in three wks 2. F/u with pmd in 1wk, to repeat cbc/bmp in 1wk. 3. Stop smoking, avoid drinking alcohol  Discharge Diagnoses:  Active Hospital Problems   Diagnosis Date Noted  . Atrial flutter with rapid ventricular response 03/27/2014  . Tobacco abuse 03/27/2014  . Alcohol dependence 03/27/2014  . A-fib 03/27/2014    Resolved Hospital Problems   Diagnosis Date Noted Date Resolved  No resolved problems to display.    Discharge Condition: stable, back NSR.  Diet recommendation: cardiac  Filed Weights   03/28/14 0515 03/29/14 0424 03/30/14 0550  Weight: 71.479 kg (157 lb 9.3 oz) 71.986 kg (158 lb 11.2 oz) 70.8 kg (156 lb 1.4 oz)    History of present illness:  Rodney Black is a 49 y.o. male, with a pmh of manic depression, who presents with fatigue, DOE, and cold symptoms for the past several months. He has been anxious because these symptoms are not resolving. Earlier this week he decided he couldn't stand it anymore and planned to come to the ER when his twin sister was available to take him. He has no hx of hypertension, says his BP has been fine when he goes to mental health appointments, but recently he has checked his BP at the pharmacy twice over the last month and found it to be elevated. He also complains of exertional dyspnea, claims that even walking one block makes her short of breath-he has to stop and take a breath/rest and then continue walking. He denies cp, palpitations orthopnea, PND, and lower extremity swelling. He has smoked 1.5 packs of cigarettes for the past 30 years. He drinks approx 2 forty ounce beers daily.   In the ER he is found to be in Aflutter with a rate of 130 - 140 bpm. His hgb is 17.8.  DDimer is negative and POC troponin is 0.0.  Hospital Course:  Principal Problem:   Atrial flutter with rapid ventricular response Active Problems:   Tobacco abuse   Alcohol dependence   A-fib  Atrial flutter with RVR Etiology unclear; alcohol and tobacco use probably involved. Heart rate is 130-140 on admission. Patient was given diltiazem in the ED. Marland KitchenCHADS2VASC SCORE IS 1. Was on cardizem drip, transitioned to oral cardizem on 2/3.  Cards/EP s/p successful Cardioversion on 2/5. Due to apixaban  drug interaction with tegretol. discussed cardiology/pharmacy for better choice of anti coagualation, recommended patient is to take apixaban for three wks post cardioversion, then stop.     Exertional dyspnea: Likely from chronic aflutter/afib. Lung clears on exam. Echo showed LVEF 25%, coreg/lsinopril started, will need repeat echo in 32months. Alcohol/tabacco cessation.  Tobacco abuse Patient has smoked 1.5 ppd x 30 years. He honestly admits he doesn't want to quit or has never really considered it, but knows he probably needs to quit. Give nicotine patches during hospital stay. smoking cessation education provided  Alcohol dependence Patient drinks 2 forty ounce beers a day. Ordered CIWA assessments, thiamine, and folate. Ativan prn.  Leukocytosis: no fever, cxr unremarkable, ua pending, nontoxic. From dehydration?/stress encourage oral intake. ua unremarkable. pmd to repeat cbc in 1wk.  Psych: very anxious, requiring frequent reassurance. Continue home meds tegretol/lithium, patient report has been on these for 15yrs. Pleasant at time of discharge. Very apologizing later for "  rudeness" earlier during hospitalization.  Procedures echo/cardioversion   Consults Cardiology/ep/case manager  Discharge Exam: BP 110/69 mmHg  Pulse 64  Temp(Src) 98.7 F (37.1 C) (Oral)  Resp 18  Ht  (1.727 m)  Wt 70.8 kg (156 lb 1.4 oz)  BMI 23.74 kg/m2  SpO2 97%  Awake Alert,  Oriented X 3, No new F.N deficits, [place].AT,PERRAL Supple Neck,No JVD, No cervical lymphadenopathy appriciated.  Symmetrical Chest wall movement, Good air movement bilaterally, CTAB RRR,,No Gallops,Rubs or new Murmurs, No Parasternal Heave +ve B.Sounds, Abd Soft, No tenderness, No organomegaly appriciated, No rebound - guarding or rigidity. No Cyanosis, Clubbing or edema, No new Rash or bruise    Discharge Instructions You were cared for by a hospitalist during your hospital stay. If you have any questions about your discharge medications or the care you received while you were in the hospital after you are discharged, you can call the unit and asked to speak with the hospitalist on call if the hospitalist that took care of you is not available. Once you are discharged, your primary care physician will handle any further medical issues. Please note that NO REFILLS for any discharge medications will be authorized once you are discharged, as it is imperative that you return to your primary care physician (or establish a relationship with a primary care physician if you do not have one) for your aftercare needs so that they can reassess your need for medications and monitor your lab values.  Discharge Instructions    Diet - low sodium heart healthy    Complete by:  As directed      Increase activity slowly    Complete by:  As directed             Medication List    TAKE these medications        apixaban 5 MG Tabs tablet  Commonly known as:  ELIQUIS  Take 1 tablet (5 mg total) by mouth 2 (two) times daily.     aspirin 325 MG tablet  Take 325 mg by mouth daily.     carbamazepine 200 MG tablet  Commonly known as:  TEGRETOL  Take 400 mg by mouth at bedtime.     carvedilol 3.125 MG tablet  Commonly known as:  COREG  Take 1 tablet (3.125 mg total) by mouth 2 (two) times daily with a meal.     lisinopril 2.5 MG tablet  Commonly known as:  ZESTRIL  Take 1 tablet (2.5 mg total) by mouth  daily.     lithium carbonate 300 MG capsule  Take 1,200 mg by mouth at bedtime.     nicotine 21 mg/24hr patch  Commonly known as:  NICODERM CQ - dosed in mg/24 hours  Place 1 patch (21 mg total) onto the skin daily.       No Known Allergies     Follow-up Information    Follow up with [facility]     On 04/05/2014.   Why:  Hospital Follow Up @ 11:15 please arrive @ 11:00 am for appointment.    Contact information:   [address] [place] [address] [phone]      Follow up with Lewayne Bunting, MD.   Specialty:  Cardiology   Why:  The office will call you to make an appoinment., If you do not hear from them, please contact them., You should be seen within 3-4 weeks.   Contact information:   [address] [place] [address] [phone]        The results of significant diagnostics from this hospitalization (including imaging, microbiology, ancillary and laboratory) are listed below for reference.    Significant Diagnostic Studies: Dg Chest 2 View  03/27/2014   CLINICAL DATA:  Chest pain and shortness of breath.  EXAM: CHEST  2 VIEW  COMPARISON:  None currently available  FINDINGS: There is moderate aortic tortuosity for age. No cardiomegaly. The hila are normal.  Mild interstitial coarsening without edema or pneumonia. No effusion or pneumothorax.  IMPRESSION: No active cardiopulmonary disease.   Electronically Signed   By: Tiburcio Pea M.D.   On: 03/27/2014 14:38    Microbiology: No results found for this or any previous visit (from the past 240 hour(s)).   Labs: Basic Metabolic Panel:  Recent Labs Lab 03/27/14 1341 03/27/14 1417 03/28/14 0800  NA 139  --  137  K 4.5  --  3.6  CL 108  --  105  CO2 22  --  24  GLUCOSE 119*  --  115*  BUN 9  --  7  CREATININE 1.04  --  0.94  CALCIUM 9.5  --  9.4  MG  --  2.0  --    Liver Function Tests:  Recent Labs Lab 03/27/14 1341  AST 25    ALT 27  ALKPHOS 93  BILITOT 0.5  PROT 7.1  ALBUMIN 4.1   No results for input(s): LIPASE, AMYLASE in the last 168 hours. No results for input(s): AMMONIA in the last 168 hours. CBC:  Recent Labs Lab 03/27/14 1341 03/28/14 0107 03/29/14 0541 03/30/14 0404 03/31/14 0305  WBC 9.5 12.4* 12.5* 13.8* 16.5*  NEUTROABS 6.2  --   --   --   --   HGB 17.8* 17.5* 17.2* 16.6 15.3  HCT 51.2 50.1 49.8 49.6 45.3  MCV 97.2 97.9 97.1 99.2 100.0  PLT 267 245 243 239 273   Cardiac Enzymes:  Recent Labs Lab 03/27/14 2109 03/28/14 0107 03/28/14 0800  TROPONINI <0.03 <0.03 0.03   BNP: BNP (last 3 results)  Recent Labs  03/27/14 2237  BNP 169.2*    ProBNP (last 3 results) No results for input(s): PROBNP in the last 8760 hours.  CBG:  Recent Labs Lab 03/30/14 0728  GLUCAP 86       Signed:  Balbina Depace  [facility] 03/31/2014, 11:21 AM

## 2014-03-31 NOTE — Progress Notes (Signed)
Patient ID: TREVIONNE SANTOR, male   DOB: [birth year], 49 y.o.   MRN: [id number]    Patient Name: Rodney Black Date of Encounter: 03/31/2014     Principal Problem:   Atrial flutter with rapid ventricular response Active Problems:   Tobacco abuse   Alcohol dependence   A-fib    SUBJECTIVE  No chest pain or sob. No palpitations.  CURRENT MEDS . apixaban  5 mg Oral BID  . carbamazepine  400 mg Oral QHS  . carvedilol  6.25 mg Oral BID WC  . diltiazem  240 mg Oral Daily  . folic acid  1 mg Oral Daily  . lisinopril  5 mg Oral Daily  . lithium carbonate  1,200 mg Oral QHS  . multivitamin with minerals  1 tablet Oral Daily  . nicotine  21 mg Transdermal Daily  . off the beat book   Does not apply Once  . potassium chloride  20 mEq Oral BID  . sodium chloride  3 mL Intravenous Q12H  . sodium chloride  3 mL Intravenous Q12H  . sodium chloride  3 mL Intravenous Q12H  . thiamine  100 mg Oral Daily   Or  . thiamine  100 mg Intravenous Daily    OBJECTIVE  Filed Vitals:   03/30/14 2012 03/31/14 0024 03/31/14 0613 03/31/14 0753  BP: 108/73 98/60 97/60  96/68  Pulse: 83 75 72 64  Temp: 98 F (36.7 C)  98.7 F (37.1 C)   TempSrc: Oral  Oral   Resp: 16  18   Height:      Weight:      SpO2: 100%  97%     Intake/Output Summary (Last 24 hours) at 03/31/14 0928 Last data filed at 03/31/14 0845  Gross per 24 hour  Intake   1100 ml  Output      0 ml  Net   1100 ml   Filed Weights   03/28/14 0515 03/29/14 0424 03/30/14 0550  Weight: 157 lb 9.3 oz (71.479 kg) 158 lb 11.2 oz (71.986 kg) 156 lb 1.4 oz (70.8 kg)    PHYSICAL EXAM  General: Pleasant, NAD. Neuro: Alert and oriented X 3. Moves all extremities spontaneously. Psych: Normal affect. HEENT:  Normal  Neck: Supple without bruits or JVD. Lungs:  Resp regular and unlabored, CTA. Heart: RRR no s3, s4, or murmurs. Abdomen: Soft, non-tender, non-distended, BS + x 4.  Extremities: No clubbing, cyanosis or edema.  DP/PT/Radials 2+ and equal bilaterally.  Accessory Clinical Findings  CBC  Recent Labs  03/30/14 0404 03/31/14 0305  WBC 13.8* 16.5*  HGB 16.6 15.3  HCT 49.6 45.3  MCV 99.2 100.0  PLT 239 273   Basic Metabolic Panel No results for input(s): NA, K, CL, CO2, GLUCOSE, BUN, CREATININE, CALCIUM, MG, PHOS in the last 72 hours. Liver Function Tests No results for input(s): AST, ALT, ALKPHOS, BILITOT, PROT, ALBUMIN in the last 72 hours. No results for input(s): LIPASE, AMYLASE in the last 72 hours. Cardiac Enzymes No results for input(s): CKTOTAL, CKMB, CKMBINDEX, TROPONINI in the last 72 hours. BNP Invalid input(s): POCBNP D-Dimer No results for input(s): DDIMER in the last 72 hours. Hemoglobin A1C No results for input(s): HGBA1C in the last 72 hours. Fasting Lipid Panel No results for input(s): CHOL, HDL, LDLCALC, TRIG, CHOLHDL, LDLDIRECT in the last 72 hours. Thyroid Function Tests No results for input(s): TSH, T4TOTAL, T3FREE, THYROIDAB in the last 72 hours.  Invalid input(s): FREET3  TELE  NSR  Radiology/Studies  Dg Chest 2 View  03/27/2014   CLINICAL DATA:  Chest pain and shortness of breath.  EXAM: CHEST  2 VIEW  COMPARISON:  None currently available  FINDINGS: There is moderate aortic tortuosity for age. No cardiomegaly. The hila are normal.  Mild interstitial coarsening without edema or pneumonia. No effusion or pneumothorax.  IMPRESSION: No active cardiopulmonary disease.   Electronically Signed   By: Tiburcio Pea M.D.   On: 03/27/2014 14:38    ASSESSMENT AND PLAN  1. Atrial flutter with an RVR 2. ETOH abuse 3. Non-ischemic CM, due to atrial flutter and ETOH 4. Tobacco abuse 5. Coags 6. H/o psych problems on tegretol/lithium Rec: ok for discharge. I discussed the importance of stopping ETOH and tobacco. I also discussed anti-coagulation with the pharmacist. There is concern that Eliquis might not be as effective with the patient on Tegritol. He is not a good  candidate for lovenox and Warfarin however. I have recommended he take his eliquis for 3 weeks and I will see him back in followup. With LV dysfunction, he will need to be discharged on coreg, lisinopril as well. Will plan to repeat 2D echo in 3 months. I'll see him back in 3-4 weeks.  Gregg Taylor,M.D.  03/31/2014 9:28 AM

## 2014-04-01 LAB — CARBAMAZEPINE, FREE AND TOTAL
CARBAMAZEPINE FREE: 1.1 ug/mL (ref 1.0–3.0)
CARBAMAZEPINE METABOLITE -: 0.8 ug/mL (ref 0.2–2.0)
CARBAMAZEPINE, TOTAL: 6.5 ug/mL (ref 4.0–12.0)
Carbamazepine Metabolite/: 0.5 ug/mL
Carbamazepine Metabolite: 0.3 ug/mL (ref 0.1–1.0)
Carbamazepine, Bound: 5.4 ug/mL

## 2014-04-02 ENCOUNTER — Encounter (HOSPITAL_COMMUNITY): Payer: Self-pay | Admitting: Cardiology

## 2014-04-05 ENCOUNTER — Encounter: Payer: Self-pay | Admitting: Internal Medicine

## 2014-04-05 ENCOUNTER — Ambulatory Visit: Payer: Self-pay | Attending: Internal Medicine | Admitting: Internal Medicine

## 2014-04-05 VITALS — BP 137/86 | HR 62 | Temp 98.0°F | Resp 16 | Wt 165.4 lb

## 2014-04-05 DIAGNOSIS — F39 Unspecified mood [affective] disorder: Secondary | ICD-10-CM | POA: Insufficient documentation

## 2014-04-05 DIAGNOSIS — F319 Bipolar disorder, unspecified: Secondary | ICD-10-CM | POA: Insufficient documentation

## 2014-04-05 DIAGNOSIS — Z720 Tobacco use: Secondary | ICD-10-CM

## 2014-04-05 DIAGNOSIS — I10 Essential (primary) hypertension: Secondary | ICD-10-CM | POA: Insufficient documentation

## 2014-04-05 DIAGNOSIS — I4892 Unspecified atrial flutter: Secondary | ICD-10-CM | POA: Insufficient documentation

## 2014-04-05 DIAGNOSIS — Z7901 Long term (current) use of anticoagulants: Secondary | ICD-10-CM | POA: Insufficient documentation

## 2014-04-05 DIAGNOSIS — F17200 Nicotine dependence, unspecified, uncomplicated: Secondary | ICD-10-CM | POA: Insufficient documentation

## 2014-04-05 DIAGNOSIS — Z7982 Long term (current) use of aspirin: Secondary | ICD-10-CM | POA: Insufficient documentation

## 2014-04-05 DIAGNOSIS — D72829 Elevated white blood cell count, unspecified: Secondary | ICD-10-CM | POA: Insufficient documentation

## 2014-04-05 HISTORY — DX: Unspecified mood (affective) disorder: F39

## 2014-04-05 LAB — COMPLETE METABOLIC PANEL WITH GFR
ALBUMIN: 4.5 g/dL (ref 3.5–5.2)
ALK PHOS: 87 U/L (ref 39–117)
ALT: 21 U/L (ref 0–53)
AST: 15 U/L (ref 0–37)
BUN: 10 mg/dL (ref 6–23)
CO2: 27 mEq/L (ref 19–32)
Calcium: 10.3 mg/dL (ref 8.4–10.5)
Chloride: 103 mEq/L (ref 96–112)
Creat: 0.96 mg/dL (ref 0.50–1.35)
GFR, Est African American: >89 mL/min
GLUCOSE: 103 mg/dL — AB (ref 70–99)
POTASSIUM: 5.3 meq/L (ref 3.5–5.3)
Sodium: 138 mEq/L (ref 135–145)
Total Bilirubin: 0.4 mg/dL (ref 0.2–1.2)
Total Protein: 7.6 g/dL (ref 6.0–8.3)

## 2014-04-05 LAB — CBC WITH DIFFERENTIAL/PLATELET
BASOS PCT: 1 % (ref 0–1)
Basophils Absolute: 0.1 10*3/uL (ref 0.0–0.1)
EOS ABS: 0.2 10*3/uL (ref 0.0–0.7)
EOS PCT: 2 % (ref 0–5)
HEMATOCRIT: 50.7 % (ref 39.0–52.0)
Hemoglobin: 17.4 g/dL — ABNORMAL HIGH (ref 13.0–17.0)
LYMPHS ABS: 2.5 10*3/uL (ref 0.7–4.0)
LYMPHS PCT: 23 % (ref 12–46)
MCH: 33.3 pg (ref 26.0–34.0)
MCHC: 34.3 g/dL (ref 30.0–36.0)
MCV: 97.1 fL (ref 78.0–100.0)
MPV: 8.9 fL (ref 8.6–12.4)
Monocytes Absolute: 0.6 10*3/uL (ref 0.1–1.0)
Monocytes Relative: 6 % (ref 3–12)
NEUTROS PCT: 68 % (ref 43–77)
Neutro Abs: 7.3 10*3/uL (ref 1.7–7.7)
Platelets: 375 10*3/uL (ref 150–400)
RBC: 5.22 MIL/uL (ref 4.22–5.81)
RDW: 13.2 % (ref 11.5–15.5)
WBC: 10.7 10*3/uL — ABNORMAL HIGH (ref 4.0–10.5)

## 2014-04-05 NOTE — Patient Instructions (Signed)
Smoking Cessation Quitting smoking is important to your health and has many advantages. However, it is not always easy to quit since nicotine is a very addictive drug. Oftentimes, people try 3 times or more before being able to quit. This document explains the best ways for you to prepare to quit smoking. Quitting takes hard work and a lot of effort, but you can do it. ADVANTAGES OF QUITTING SMOKING  You will live longer, feel better, and live better.  Your body will feel the impact of quitting smoking almost immediately.  Within 20 minutes, blood pressure decreases. Your pulse returns to its normal level.  After 8 hours, carbon monoxide levels in the blood return to normal. Your oxygen level increases.  After 24 hours, the chance of having a heart attack starts to decrease. Your breath, hair, and body stop smelling like smoke.  After 48 hours, damaged nerve endings begin to recover. Your sense of taste and smell improve.  After 72 hours, the body is virtually free of nicotine. Your bronchial tubes relax and breathing becomes easier.  After 2 to 12 weeks, lungs can hold more air. Exercise becomes easier and circulation improves.  The risk of having a heart attack, stroke, cancer, or lung disease is greatly reduced.  After 1 year, the risk of coronary heart disease is cut in half.  After 5 years, the risk of stroke falls to the same as a nonsmoker.  After 10 years, the risk of lung cancer is cut in half and the risk of other cancers decreases significantly.  After 15 years, the risk of coronary heart disease drops, usually to the level of a nonsmoker.  If you are pregnant, quitting smoking will improve your chances of having a healthy baby.  The people you live with, especially any children, will be healthier.  You will have extra money to spend on things other than cigarettes. QUESTIONS TO THINK ABOUT BEFORE ATTEMPTING TO QUIT You may want to talk about your answers with your  health care provider.  Why do you want to quit?  If you tried to quit in the past, what helped and what did not?  What will be the most difficult situations for you after you quit? How will you plan to handle them?  Who can help you through the tough times? Your family? Friends? A health care provider?  What pleasures do you get from smoking? What ways can you still get pleasure if you quit? Here are some questions to ask your health care provider:  How can you help me to be successful at quitting?  What medicine do you think would be best for me and how should I take it?  What should I do if I need more help?  What is smoking withdrawal like? How can I get information on withdrawal? GET READY  Set a quit date.  Change your environment by getting rid of all cigarettes, ashtrays, matches, and lighters in your home, car, or work. Do not let people smoke in your home.  Review your past attempts to quit. Think about what worked and what did not. GET SUPPORT AND ENCOURAGEMENT You have a better chance of being successful if you have help. You can get support in many ways.  Tell your family, friends, and coworkers that you are going to quit and need their support. Ask them not to smoke around you.  Get individual, group, or telephone counseling and support. Programs are available at local hospitals and health centers. Call   your local health department for information about programs in your area.  Spiritual beliefs and practices may help some smokers quit.  Download a "quit meter" on your computer to keep track of quit statistics, such as how long you have gone without smoking, cigarettes not smoked, and money saved.  Get a self-help book about quitting smoking and staying off tobacco. LEARN NEW SKILLS AND BEHAVIORS  Distract yourself from urges to smoke. Talk to someone, go for a walk, or occupy your time with a task.  Change your normal routine. Take a different route to work.  Drink tea instead of coffee. Eat breakfast in a different place.  Reduce your stress. Take a hot bath, exercise, or read a book.  Plan something enjoyable to do every day. Reward yourself for not smoking.  Explore interactive web-based programs that specialize in helping you quit. GET MEDICINE AND USE IT CORRECTLY Medicines can help you stop smoking and decrease the urge to smoke. Combining medicine with the above behavioral methods and support can greatly increase your chances of successfully quitting smoking.  Nicotine replacement therapy helps deliver nicotine to your body without the negative effects and risks of smoking. Nicotine replacement therapy includes nicotine gum, lozenges, inhalers, nasal sprays, and skin patches. Some may be available over-the-counter and others require a prescription.  Antidepressant medicine helps people abstain from smoking, but how this works is unknown. This medicine is available by prescription.  Nicotinic receptor partial agonist medicine simulates the effect of nicotine in your brain. This medicine is available by prescription. Ask your health care provider for advice about which medicines to use and how to use them based on your health history. Your health care provider will tell you what side effects to look out for if you choose to be on a medicine or therapy. Carefully read the information on the package. Do not use any other product containing nicotine while using a nicotine replacement product.  RELAPSE OR DIFFICULT SITUATIONS Most relapses occur within the first 3 months after quitting. Do not be discouraged if you start smoking again. Remember, most people try several times before finally quitting. You may have symptoms of withdrawal because your body is used to nicotine. You may crave cigarettes, be irritable, feel very hungry, cough often, get headaches, or have difficulty concentrating. The withdrawal symptoms are only temporary. They are strongest  when you first quit, but they will go away within 10-14 days. To reduce the chances of relapse, try to:  Avoid drinking alcohol. Drinking lowers your chances of successfully quitting.  Reduce the amount of caffeine you consume. Once you quit smoking, the amount of caffeine in your body increases and can give you symptoms, such as a rapid heartbeat, sweating, and anxiety.  Avoid smokers because they can make you want to smoke.  Do not let weight gain distract you. Many smokers will gain weight when they quit, usually less than 10 pounds. Eat a healthy diet and stay active. You can always lose the weight gained after you quit.  Find ways to improve your mood other than smoking. FOR MORE INFORMATION  www.smokefree.gov  Document Released: 02/03/2001 Document Revised: 06/26/2013 Document Reviewed: 05/21/2011 ExitCare Patient Information 2015 [employer]. This information is not intended to replace advice given to you by your health care provider. Make sure you discuss any questions you have with your health care provider. DASH Eating Plan DASH stands for "Dietary Approaches to Stop Hypertension." The DASH eating plan is a healthy eating plan that has   been shown to reduce high blood pressure (hypertension). Additional health benefits may include reducing the risk of type 2 diabetes mellitus, heart disease, and stroke. The DASH eating plan may also help with weight loss. WHAT DO I NEED TO KNOW ABOUT THE DASH EATING PLAN? For the DASH eating plan, you will follow these general guidelines:  Choose foods with a percent daily value for sodium of less than 5% (as listed on the food label).  Use salt-free seasonings or herbs instead of table salt or sea salt.  Check with your health care provider or pharmacist before using salt substitutes.  Eat lower-sodium products, often labeled as "lower sodium" or "no salt added."  Eat fresh foods.  Eat more vegetables, fruits, and low-fat dairy  products.  Choose whole grains. Look for the word "whole" as the first word in the ingredient list.  Choose fish and skinless chicken or turkey more often than red meat. Limit fish, poultry, and meat to 6 oz (170 g) each day.  Limit sweets, desserts, sugars, and sugary drinks.  Choose heart-healthy fats.  Limit cheese to 1 oz (28 g) per day.  Eat more home-cooked food and less restaurant, buffet, and fast food.  Limit fried foods.  Cook foods using methods other than frying.  Limit canned vegetables. If you do use them, rinse them well to decrease the sodium.  When eating at a restaurant, ask that your food be prepared with less salt, or no salt if possible. WHAT FOODS CAN I EAT? Seek help from a dietitian for individual calorie needs. Grains Whole grain or whole wheat bread. Brown rice. Whole grain or whole wheat pasta. Quinoa, bulgur, and whole grain cereals. Low-sodium cereals. Corn or whole wheat flour tortillas. Whole grain cornbread. Whole grain crackers. Low-sodium crackers. Vegetables Fresh or frozen vegetables (raw, steamed, roasted, or grilled). Low-sodium or reduced-sodium tomato and vegetable juices. Low-sodium or reduced-sodium tomato sauce and paste. Low-sodium or reduced-sodium canned vegetables.  Fruits All fresh, canned (in natural juice), or frozen fruits. Meat and Other Protein Products Ground beef (85% or leaner), grass-fed beef, or beef trimmed of fat. Skinless chicken or turkey. Ground chicken or turkey. Pork trimmed of fat. All fish and seafood. Eggs. Dried beans, peas, or lentils. Unsalted nuts and seeds. Unsalted canned beans. Dairy Low-fat dairy products, such as skim or 1% milk, 2% or reduced-fat cheeses, low-fat ricotta or cottage cheese, or plain low-fat yogurt. Low-sodium or reduced-sodium cheeses. Fats and Oils Tub margarines without trans fats. Light or reduced-fat mayonnaise and salad dressings (reduced sodium). Avocado. Safflower, olive, or canola  oils. Natural peanut or almond butter. Other Unsalted popcorn and pretzels. The items listed above may not be a complete list of recommended foods or beverages. Contact your dietitian for more options. WHAT FOODS ARE NOT RECOMMENDED? Grains White bread. White pasta. White rice. Refined cornbread. Bagels and croissants. Crackers that contain trans fat. Vegetables Creamed or fried vegetables. Vegetables in a cheese sauce. Regular canned vegetables. Regular canned tomato sauce and paste. Regular tomato and vegetable juices. Fruits Dried fruits. Canned fruit in light or heavy syrup. Fruit juice. Meat and Other Protein Products Fatty cuts of meat. Ribs, chicken wings, bacon, sausage, bologna, salami, chitterlings, fatback, hot dogs, bratwurst, and packaged luncheon meats. Salted nuts and seeds. Canned beans with salt. Dairy Whole or 2% milk, cream, half-and-half, and cream cheese. Whole-fat or sweetened yogurt. Full-fat cheeses or blue cheese. Nondairy creamers and whipped toppings. Processed cheese, cheese spreads, or cheese curds. Condiments Onion and garlic salt,   seasoned salt, table salt, and sea salt. Canned and packaged gravies. Worcestershire sauce. Tartar sauce. Barbecue sauce. Teriyaki sauce. Soy sauce, including reduced sodium. Steak sauce. Fish sauce. Oyster sauce. Cocktail sauce. Horseradish. Ketchup and mustard. Meat flavorings and tenderizers. Bouillon cubes. Hot sauce. Tabasco sauce. Marinades. Taco seasonings. Relishes. Fats and Oils Butter, stick margarine, lard, shortening, ghee, and bacon fat. Coconut, palm kernel, or palm oils. Regular salad dressings. Other Pickles and olives. Salted popcorn and pretzels. The items listed above may not be a complete list of foods and beverages to avoid. Contact your dietitian for more information. WHERE CAN I FIND MORE INFORMATION? National Heart, Lung, and Blood Institute: www.nhlbi.nih.gov/health/health-topics/topics/dash/ Document Released:  01/29/2011 Document Revised: 06/26/2013 Document Reviewed: 12/14/2012 ExitCare Patient Information 2015 [employer]. This information is not intended to replace advice given to you by your health care provider. Make sure you discuss any questions you have with your health care provider.  

## 2014-04-05 NOTE — Progress Notes (Deleted)
MRN: [id number] Name: Rodney Black  Sex: male Age: 49 y.o. DOB: 1966/01/04  Allergies: Review of patient's allergies indicates no known allergies.  Chief Complaint  Patient presents with  . Hospitalization Follow-up    HPI: Patient is 49 y.o. male who   Past Medical History  Diagnosis Date  . Manic depression   . Bipolar 1 disorder   . Tobacco abuse   . Alcohol abuse   . Atrial flutter with rapid ventricular response 03/27/2014    unknown duration, newly diagnosed 03/2014    Past Surgical History  Procedure Laterality Date  . Arm fracture      when patient was young  . Atrial flutter ablation N/A 03/30/2014    Procedure: ATRIAL FLUTTER ABLATION;  Surgeon: [name], MD;  Location: [facility] CATH LAB;  Service: Cardiovascular;  Laterality: N/A;  . Tee without cardioversion N/A 03/30/2014    Procedure: TRANSESOPHAGEAL ECHOCARDIOGRAM (TEE);  Surgeon: [name], MD;  Location: [facility] ENDOSCOPY;  Service: Cardiovascular;  Laterality: N/A;      Medication List       This list is accurate as of: 04/05/14 11:57 AM.  Always use your most recent med list.               apixaban 5 MG Tabs tablet  Commonly known as:  ELIQUIS  Take 1 tablet (5 mg total) by mouth 2 (two) times daily.     aspirin 325 MG tablet  Take 325 mg by mouth daily.     carbamazepine 200 MG tablet  Commonly known as:  TEGRETOL  Take 400 mg by mouth at bedtime.     carvedilol 3.125 MG tablet  Commonly known as:  COREG  Take 1 tablet (3.125 mg total) by mouth 2 (two) times daily with a meal.     lisinopril 2.5 MG tablet  Commonly known as:  ZESTRIL  Take 1 tablet (2.5 mg total) by mouth daily.     lithium carbonate 300 MG capsule  Take 1,200 mg by mouth at bedtime.     nicotine 21 mg/24hr patch  Commonly known as:  NICODERM CQ - dosed in mg/24 hours  May apply to arm.  patch qd for 6wks, than  patch qd for 2wks, then  patch for 2wks, then stop.        No orders of the defined  types were placed in this encounter.     There is no immunization history on file for this patient.  Family History  Problem Relation Age of Onset  . Hypertension Mother   . Heart attack Mother 81  . Heart disease Mother   . Lung cancer Father     heavy smoker    History  Substance Use Topics  . Smoking status: Current Every Day Smoker  . Smokeless tobacco: Not on file     Comment: 1.5 pack per day since high school  . Alcohol Use: 0.0 oz/week    0 Standard drinks or equivalent per week     Comment: 2x 40 oz beers daily    Review of Systems   As noted in HPI  Filed Vitals:   04/05/14 1126  BP: 137/86  Pulse: 62  Temp: 98 F (36.7 C)  Resp: 16    Physical Exam  Physical Exam  CBC    Component Value Date/Time   WBC 16.5* 03/31/2014 0305   RBC 4.53 03/31/2014 0305   HGB 15.3 03/31/2014 0305   HCT 45.3 03/31/2014  0305   PLT 273 03/31/2014 0305   MCV 100.0 03/31/2014 0305   LYMPHSABS 2.6 03/27/2014 1341   MONOABS 0.5 03/27/2014 1341   EOSABS 0.2 03/27/2014 1341   BASOSABS 0.1 03/27/2014 1341    CMP     Component Value Date/Time   NA 137 03/28/2014 0800   K 3.6 03/28/2014 0800   CL 105 03/28/2014 0800   CO2 24 03/28/2014 0800   GLUCOSE 115* 03/28/2014 0800   BUN 7 03/28/2014 0800   CREATININE 0.94 03/28/2014 0800   CALCIUM 9.4 03/28/2014 0800   PROT 7.1 03/27/2014 1341   ALBUMIN 4.1 03/27/2014 1341   AST 25 03/27/2014 1341   ALT 27 03/27/2014 1341   ALKPHOS 93 03/27/2014 1341   BILITOT 0.5 03/27/2014 1341   GFRNONAA >90 03/28/2014 0800   GFRAA >90 03/28/2014 0800    No results found for: CHOL  No components found for: HGA1C  Lab Results  Component Value Date/Time   AST 25 03/27/2014 01:41 PM    Assessment and Plan  Essential hypertension - Plan: COMPLETE METABOLIC PANEL WITH GFR, Vit D  25 hydroxy (rtn osteoporosis monitoring)  Tobacco abuse  Atrial flutter with rapid ventricular response  Mood disorder  Leukocytosis - Plan:  CBC with Differential/Platelet   Health Maintenance -Colonoscopy: -Pap Smear: -Mammogram: -Vaccinations:  -TdAP  -PNA (PPSV23) (one dose after 65) (or one dose before 65 if chronic conditions)  -Zoster (1 dose after 60 yrs)  -Influenza  Return in about 3 months (around 07/04/2014) for hypertension.  [name], MD

## 2014-04-05 NOTE — Progress Notes (Signed)
Patient Demographics  Rodney Black, is a 49 y.o. male  IRC:[id number]  PZW:[id number]  DOB - 01-03-1966  CC:  Chief Complaint  Patient presents with  . Hospitalization Follow-up       HPI: Rodney Black is a 49 y.o. male here today to establish medical care.Patient has history of manic depression, tobacco abuse, alcohol abuse, recently hospitalized with symptoms of fatigue dyspnea on exertion cold symptoms, EMR reviewed patient was found to have atrial flutter with RVR, initially was given Cardizem in the emergency room o on was on Cardizem drip  and subsequently switched to oral, cardiology on board patient underwent cardioversion, was advised to continue with anti-coagulation with apixaban for 3 weeks post cardioversion then stop, patient had echocardiogram done which reported EF of 25%, he was started on Coreg and lisinopril, was counseled to quit smoking, also patient had leukocytosis next repeat CBC, was advised to follow with cardiology. As per patient he has not drank alcohol since the discharge and will try to quit smoking already has nicotine patch. Patient also follows up with psychiatrist. Patient has No headache, No chest pain, No abdominal pain - No Nausea, No new weakness tingling or numbness, No Cough - SOB.  No Known Allergies Past Medical History  Diagnosis Date  . Manic depression   . Bipolar 1 disorder   . Tobacco abuse   . Alcohol abuse   . Atrial flutter with rapid ventricular response 03/27/2014    unknown duration, newly diagnosed 03/2014   Current Outpatient Prescriptions on File Prior to Visit  Medication Sig Dispense Refill  . apixaban (ELIQUIS) 5 MG TABS tablet Take 1 tablet (5 mg total) by mouth 2 (two) times daily. 21 tablet 0  . aspirin 325 MG tablet Take 325 mg by mouth daily.    . carbamazepine (TEGRETOL) 200 MG tablet Take 400 mg by mouth at bedtime.    . carvedilol (COREG) 3.125 MG tablet Take 1 tablet (3.125 mg total) by mouth 2 (two) times  daily with a meal. 60 tablet 3  . lisinopril (ZESTRIL) 2.5 MG tablet Take 1 tablet (2.5 mg total) by mouth daily. 30 tablet 3  . lithium carbonate 300 MG capsule Take 1,200 mg by mouth at bedtime.    . nicotine (NICODERM CQ - DOSED IN MG/24 HOURS) 21 mg/24hr patch May apply to arm. 21mg  patch qd for 6wks, than 14mg  patch qd for 2wks, then 7mg  patch for 2wks, then stop. 42 patch 0   No current facility-administered medications on file prior to visit.   Family History  Problem Relation Age of Onset  . Hypertension Mother   . Heart attack Mother 73  . Heart disease Mother   . Lung cancer Father     heavy smoker   History   Social History  . Marital Status: Single    Spouse Name: N/A  . Number of Children: N/A  . Years of Education: N/A   Occupational History  . janitor    Social History Main Topics  . Smoking status: Current Every Day Smoker  . Smokeless tobacco: Not on file     Comment: 1.5 pack per day since high school  . Alcohol Use: 0.0 oz/week    0 Standard drinks or equivalent per week     Comment: 2x 40 oz beers daily  . Drug Use: Yes     Comment: occasional marijuana use  . Sexual Activity: Not on file   Other Topics Concern  . Not on  file   Social History Narrative    Review of Systems: Constitutional: Negative for fever, chills, diaphoresis, activity change, appetite change and fatigue. HENT: Negative for ear pain, nosebleeds, congestion, facial swelling, rhinorrhea, neck pain, neck stiffness and ear discharge.  Eyes: Negative for pain, discharge, redness, itching and visual disturbance. Respiratory: Negative for cough, choking, chest tightness, shortness of breath, wheezing and stridor.  Cardiovascular: Negative for chest pain, palpitations and leg swelling. Gastrointestinal: Negative for abdominal distention. Genitourinary: Negative for dysuria, urgency, frequency, hematuria, flank pain, decreased urine volume, difficulty urinating and dyspareunia.    Musculoskeletal: Negative for back pain, joint swelling, arthralgia and gait problem. Neurological: Negative for dizziness, tremors, seizures, syncope, facial asymmetry, speech difficulty, weakness, light-headedness, numbness and headaches.  Hematological: Negative for adenopathy. Does not bruise/bleed easily. Psychiatric/Behavioral: Negative for hallucinations, behavioral problems, confusion, dysphoric mood, decreased concentration and agitation.    Objective:   Filed Vitals:   04/05/14 1126  BP: 137/86  Pulse: 62  Temp: 98 F (36.7 C)  Resp: 16    Physical Exam: Constitutional: Patient appears well-developed and well-nourished. No distress. HENT: Normocephalic, atraumatic, External right and left ear normal. Oropharynx is clear and moist.  Eyes: Conjunctivae and EOM are normal. PERRLA, no scleral icterus. Neck: Normal ROM. Neck supple. No JVD. No tracheal deviation. No thyromegaly. CVS: RRR, S1/S2 +, no murmurs, no gallops, no carotid bruit.  Pulmonary: Effort and breath sounds normal, no stridor, rhonchi, wheezes, rales.  Abdominal: Soft. BS +, no distension, tenderness, rebound or guarding.  Musculoskeletal: Normal range of motion. No edema and no tenderness.  Neuro: Alert. Normal reflexes, muscle tone coordination. No cranial nerve deficit. Skin: Skin is warm and dry. No rash noted. Not diaphoretic. No erythema. No pallor. Psychiatric: Normal mood and affect. Behavior, judgment, thought content normal.  Lab Results  Component Value Date   WBC 16.5* 03/31/2014   HGB 15.3 03/31/2014   HCT 45.3 03/31/2014   MCV 100.0 03/31/2014   PLT 273 03/31/2014   Lab Results  Component Value Date   CREATININE 0.94 03/28/2014   BUN 7 03/28/2014   NA 137 03/28/2014   K 3.6 03/28/2014   CL 105 03/28/2014   CO2 24 03/28/2014    Lab Results  Component Value Date   HGBA1C 5.7* 03/27/2014   Lipid Panel  No results found for: CHOL, TRIG, HDL, CHOLHDL, VLDL, LDLCALC      Assessment and plan:   1. Essential hypertension Advised patient for DASH diet, continue with current meds.  - COMPLETE METABOLIC PANEL WITH GFR - Vit D  25 hydroxy (rtn osteoporosis monitoring)  2. Tobacco abuse Patient is going to try nicotine patch and could smoking.  3. Atrial flutter with rapid ventricular response Status post cardioversion currently in sinus rhythm, rate is controlled. Patient is on Coreg , and is on Eliquis for 3 weeks and is going to follow with cardiology.  4. Mood disorder Following her with the psychiatrist.  5. Leukocytosis Will repeat - CBC with Differential/Platelet     Health Maintenance  -Vaccinations:   Patient declined flu shot and Pneumovax.    Addendum  There was letter done in this encounter for jury duty exemption  which was intended for the different patient and is done by mistake that letter is not for Mr. Jawann Urbani     Return in about 3 months (around 07/04/2014) for hypertension.   [name], MD

## 2014-04-05 NOTE — Progress Notes (Signed)
Patient here for hospital follow up Was admitted with atrial flutter Has a history of hypertension Taking tegretol and lithium prescribed by [facility] Patient refused flu vaccine

## 2014-04-06 LAB — VITAMIN D 25 HYDROXY (VIT D DEFICIENCY, FRACTURES): Vit D, 25-Hydroxy: 29 ng/mL — ABNORMAL LOW (ref 30–100)

## 2014-04-19 ENCOUNTER — Telehealth: Payer: Self-pay

## 2014-04-19 NOTE — Telephone Encounter (Signed)
Patient not available  Left message on machine to return our call 

## 2014-04-19 NOTE — Telephone Encounter (Signed)
-----   Message from [name], MD sent at 04/06/2014  9:18 AM EST ----- Call and let the patient know that his WBC count is improved and is borderline elevated, will repeat on the following visit.  noticed low vitamin D, advise patient to start taking OTC 2000 units daily.

## 2014-05-29 ENCOUNTER — Ambulatory Visit (INDEPENDENT_AMBULATORY_CARE_PROVIDER_SITE_OTHER): Payer: Self-pay | Admitting: Nurse Practitioner

## 2014-05-29 ENCOUNTER — Encounter: Payer: Self-pay | Admitting: Nurse Practitioner

## 2014-05-29 VITALS — BP 122/84 | HR 58 | Resp 18 | Ht 68.0 in | Wt 168.0 lb

## 2014-05-29 DIAGNOSIS — I429 Cardiomyopathy, unspecified: Secondary | ICD-10-CM

## 2014-05-29 DIAGNOSIS — Z9889 Other specified postprocedural states: Secondary | ICD-10-CM

## 2014-05-29 DIAGNOSIS — I4892 Unspecified atrial flutter: Secondary | ICD-10-CM

## 2014-05-29 DIAGNOSIS — I428 Other cardiomyopathies: Secondary | ICD-10-CM

## 2014-05-29 LAB — BASIC METABOLIC PANEL
BUN: 12 mg/dL (ref 6–23)
CO2: 28 mEq/L (ref 19–32)
Calcium: 9.7 mg/dL (ref 8.4–10.5)
Chloride: 106 mEq/L (ref 96–112)
Creatinine, Ser: 0.93 mg/dL (ref 0.40–1.50)
GFR: 92.04 mL/min (ref >60.00)
Glucose, Bld: 108 mg/dL — ABNORMAL HIGH (ref 70–99)
Potassium: 4.4 mEq/L (ref 3.5–5.1)
Sodium: 136 mEq/L (ref 135–145)

## 2014-05-29 MED ORDER — LISINOPRIL 5 MG PO TABS: 5.0000 mg | ORAL_TABLET | Freq: Every day | ORAL | Status: DC

## 2014-05-29 NOTE — Progress Notes (Signed)
CARDIOLOGY OFFICE NOTE  Date:  05/29/2014    Rodney Black Date of Birth: 1965/08/04 Medical Record #[id number]  PCP:  [name], MD  Cardiologist:  [name]    Chief Complaint  Patient presents with  . Atrial Flutter    Post hospital visit - seen for Dr. [name]     History of Present Illness: Rodney Black is a 49 y.o. male who presents today for a post hospital visit. Seen for Dr. [name]. He has a history of manic depression/bipolar disorder who had not had medical care in several years. He presented to the ER on 03-27-14 with a 2 month history of progressive fatigue and dyspnea on exertion. He was found to be in atrial flutter with RVR. Echocardiogram demonstrated EF of 25%. EP was asked to evaluate for treatment options. He has been felt to be a poor candidate for lovenox/ coumadin. He was been started on eliquis.He has mult-isubstance abuse with tobacco and alcohol. He had successful radiofrequency ablation of atrial flutter per Dr. [name].  Due to apixaban drug interaction with tegretol- discussed cardiology/pharmacy for better choice of anticoagulation - it was recommended patient was to take apixaban for three wks post cardioversion and then stop.   Comes back today. Here alone. Was discharged 2 months ago. He says he is doing ok. Continues to smoke - not ready to stop. Back drinking - hard to quantify. Taking his medicines. Not short of breath. Not dizzy or lightheaded. No palpitations. Just on aspirin. Tells me he has chronic issues with not being able to sleep. Uses alcohol to sleep.  Past Medical History  Diagnosis Date  . Manic depression   . Bipolar 1 disorder   . Tobacco abuse   . Alcohol abuse   . Atrial flutter with rapid ventricular response 03/27/2014    unknown duration, newly diagnosed 03/2014    Past Surgical History  Procedure Laterality Date  . Arm fracture      when patient was young  . Atrial flutter ablation N/A 03/30/2014    Procedure:  ATRIAL FLUTTER ABLATION;  Surgeon: [name], MD;  Location: [facility] CATH LAB;  Service: Cardiovascular;  Laterality: N/A;  . Tee without cardioversion N/A 03/30/2014    Procedure: TRANSESOPHAGEAL ECHOCARDIOGRAM (TEE);  Surgeon: [name], MD;  Location: [facility] ENDOSCOPY;  Service: Cardiovascular;  Laterality: N/A;     Medications: Current Outpatient Prescriptions  Medication Sig Dispense Refill  . aspirin 325 MG tablet Take 325 mg by mouth daily.    . carbamazepine (TEGRETOL) 200 MG tablet Take 400 mg by mouth at bedtime.    . carvedilol (COREG) 3.125 MG tablet Take 1 tablet (3.125 mg total) by mouth 2 (two) times daily with a meal. 60 tablet 3  . lithium carbonate 300 MG capsule Take 1,200 mg by mouth at bedtime.    Marland Kitchen lisinopril (PRINIVIL,ZESTRIL) 5 MG tablet Take 1 tablet (5 mg total) by mouth daily. 90 tablet 3   No current facility-administered medications for this visit.    Allergies: No Known Allergies  Social History: The patient  reports that he has been smoking.  He does not have any smokeless tobacco history on file. He reports that he drinks alcohol. He reports that he uses illicit drugs.   Family History: The patient's family history includes Heart attack (age of onset: 72) in his mother; Heart disease in his mother; Hypertension in his mother; Lung cancer in his father.   Review of Systems: Please see  the history of present illness.   Otherwise, the review of systems is positive for .   All other systems are reviewed and negative.   Physical Exam: VS:  BP 122/84 mmHg  Pulse 58  Resp 18  Ht 5\' 8"  (1.727 m)  Wt 168 lb (76.204 kg)  BMI 25.55 kg/m2  SpO2 98% .  BMI Body mass index is 25.55 kg/(m^2).  Wt Readings from Last 3 Encounters:  05/29/14 168 lb (76.204 kg)  04/05/14 165 lb 6.4 oz (75.025 kg)  03/30/14 156 lb 1.4 oz (70.8 kg)    General: Alert. Looks older than his stated age and in no acute distress.  HEENT: Normal. Neck: Supple, no JVD, carotid  bruits, or masses noted.  Cardiac: Regular rate and rhythm. No murmurs, rubs, or gallops. No edema.  Respiratory:  Lungs are coarse to auscultation bilaterally with normal work of breathing. Scattered wheezing noted.  GI: Soft and nontender.  MS: No deformity or atrophy. Gait and ROM intact. Skin: Warm and dry. Color is normal.  Neuro:  Strength and sensation are intact and no gross focal deficits noted.  Psych: Alert, appropriate and with normal affect.   LABORATORY DATA:  EKG:  EKG is ordered today. This demonstrates NSR.  Lab Results  Component Value Date   WBC 10.7* 04/05/2014   HGB 17.4* 04/05/2014   HCT 50.7 04/05/2014   PLT 375 04/05/2014   GLUCOSE 103* 04/05/2014   ALT 21 04/05/2014   AST 15 04/05/2014   NA 138 04/05/2014   K 5.3 04/05/2014   CL 103 04/05/2014   CREATININE 0.96 04/05/2014   BUN 10 04/05/2014   CO2 27 04/05/2014   TSH 1.178 03/27/2014   INR 1.01 03/27/2014   HGBA1C 5.7* 03/27/2014    BNP (last 3 results)  Recent Labs  03/27/14 2237  BNP 169.2*    ProBNP (last 3 results) No results for input(s): PROBNP in the last 8760 hours.   Other Studies Reviewed Today:  Echo Study Conclusions from 03/2014  - Left ventricle: The cavity size was normal. Wall thickness was normal. Indeterminant diastolic function. The estimated ejection fraction was 25%. Diffuse hypokinesis. - Aortic valve: There was no stenosis. - Mitral valve: Mildly calcified annulus. Mildly calcified leaflets . There was no significant regurgitation. - Left atrium: The atrium was mildly dilated. - Right ventricle: The cavity size was normal. Systolic function was mildly reduced. - Pulmonary arteries: No complete TR doppler jet so unable to estimate PA systolic pressure. - Systemic veins: IVC measured 2.2 cm with > 50% respirophasic variation, suggesting RA pressure 8 mmHg. - Pericardium, extracardiac: A trivial pericardial effusion  was identified.  Impressions:  - The patient was in atrial flutter. Normal LV size with diffuse hypokinesis, EF 25%. Normal RV size with mildly decreased systolic function. No significant valvuular abnormalities.  ABLATION CONCLUSIONS:  1. Isthmus-dependent right atrial flutter upon presentation.  2. Successful radiofrequency ablation of atrial flutter along the cavotricuspid isthmus with complete bidirectional isthmus block achieved.  3. No inducible arrhythmias following ablation.  4. No early apparent complications.   Lewayne Bunting, MD  9:46 AM 03/30/2014  Assessment/Plan: 1. Typical atrial flutter - with associated decompensated CHF - CHADS2VASC of 1 -  Heavy drinker and poor candidate for long term therapy. Now s/p ablation - remains in NSR.   2. ETOH Cessation is strongly advised - do not get the impression he is ready to stop.  3. Chronic systolic heart failure Likely tachycardia mediated.He appears compensated with  no symptoms.  Increase his ACE today to 5 mg. Echo in 4 weeks.   4. Tobacco Cessation advised -  Not ready to stop.  Current medicines are reviewed with the patient today.  The patient does not have concerns regarding medicines other than what has been noted above.  The following changes have been made:  See above.  Labs/ tests ordered today include:    Orders Placed This Encounter  Procedures  . Basic metabolic panel  . EKG 12-Lead  . 2D Echocardiogram without contrast     Disposition:   FU with Dr. [name] after echo.   Patient is agreeable to this plan and will call if any problems develop in the interim.   Signed: Rosalio Macadamia, RN, ANP-C 05/29/2014 9:33 AM  [facility] [address] [place], [place]  [address] Phone: [phone] Fax: [phone]

## 2014-05-29 NOTE — Patient Instructions (Signed)
We will be checking the following labs today BMET  Increase your Lisinopril to 5 mg a day - this is at the drug store - you can take 2 of your 2.5 mg tabs to equal this dose  Echocardiogram in one month  Follow up with Dr. [name] after echo  Call the [facility] office at [phone] if you have any questions, problems or concerns.

## 2014-07-10 ENCOUNTER — Other Ambulatory Visit (HOSPITAL_COMMUNITY): Payer: Self-pay

## 2014-07-10 ENCOUNTER — Ambulatory Visit (HOSPITAL_COMMUNITY): Payer: Self-pay | Attending: Cardiovascular Disease

## 2014-07-10 ENCOUNTER — Other Ambulatory Visit: Payer: Self-pay

## 2014-07-10 DIAGNOSIS — Z9889 Other specified postprocedural states: Secondary | ICD-10-CM

## 2014-07-10 DIAGNOSIS — I4892 Unspecified atrial flutter: Secondary | ICD-10-CM

## 2014-07-10 DIAGNOSIS — I429 Cardiomyopathy, unspecified: Secondary | ICD-10-CM

## 2014-07-10 DIAGNOSIS — I428 Other cardiomyopathies: Secondary | ICD-10-CM

## 2014-07-10 DIAGNOSIS — I10 Essential (primary) hypertension: Secondary | ICD-10-CM | POA: Insufficient documentation

## 2014-07-11 ENCOUNTER — Telehealth: Payer: Self-pay | Admitting: Internal Medicine

## 2014-07-11 NOTE — Telephone Encounter (Signed)
Follow Up  Pt called states that he received a call that his results were in. Please assist

## 2014-07-11 NOTE — Telephone Encounter (Signed)
Reviewed results of patient's echo with him; he verbalized understanding of results and plan of care.

## 2014-07-17 ENCOUNTER — Ambulatory Visit (INDEPENDENT_AMBULATORY_CARE_PROVIDER_SITE_OTHER): Payer: Self-pay | Admitting: Internal Medicine

## 2014-07-17 ENCOUNTER — Encounter: Payer: Self-pay | Admitting: Internal Medicine

## 2014-07-17 VITALS — BP 124/86 | HR 63 | Ht 68.0 in | Wt 171.2 lb

## 2014-07-17 DIAGNOSIS — I10 Essential (primary) hypertension: Secondary | ICD-10-CM

## 2014-07-17 DIAGNOSIS — F1020 Alcohol dependence, uncomplicated: Secondary | ICD-10-CM

## 2014-07-17 DIAGNOSIS — I4892 Unspecified atrial flutter: Secondary | ICD-10-CM

## 2014-07-17 DIAGNOSIS — Z720 Tobacco use: Secondary | ICD-10-CM

## 2014-07-17 NOTE — Assessment & Plan Note (Signed)
I have asked the patient to stop stopping. He states that he will try to reduce his consumption.

## 2014-07-17 NOTE — Assessment & Plan Note (Signed)
He is maintaining NSR after ablation. No change in meds. He is encouraged to stop ETOH.

## 2014-07-17 NOTE — Assessment & Plan Note (Signed)
We discussed the importance of stopping drinking.

## 2014-07-17 NOTE — Patient Instructions (Signed)
Medication Instructions:  Your physician recommends that you continue on your current medications as directed. Please refer to the Current Medication list given to you today.   Labwork: NONE  Testing/Procedures: NONE  Follow-Up: Follow up with Dr. Taylor as needed.  Any Other Special Instructions Will Be Listed Below (If Applicable).   

## 2014-07-17 NOTE — Progress Notes (Signed)
HPI [name] returns today for followup. He is a 49 yo man with multiple substance issues, mostly ETOH, who developed atrial flutter with an RVR, underwent catheter ablation and repeat echo demonstrated that his EF has normalized. He has been stable in the interim. He is still drinking ETOH in excess but has reduced his consumption to some extent.  No Known Allergies   Current Outpatient Prescriptions  Medication Sig Dispense Refill  . aspirin 325 MG tablet Take 325 mg by mouth daily.    . carbamazepine (TEGRETOL) 200 MG tablet Take 400 mg by mouth at bedtime.    Marland Kitchen lithium carbonate 300 MG capsule Take 1,200 mg by mouth at bedtime.    . carvedilol (COREG) 3.125 MG tablet Take 1 tablet (3.125 mg total) by mouth 2 (two) times daily with a meal. 60 tablet 3  . lisinopril (PRINIVIL,ZESTRIL) 5 MG tablet Take 1 tablet (5 mg total) by mouth daily. 90 tablet 3   No current facility-administered medications for this visit.     Past Medical History  Diagnosis Date  . Manic depression   . Bipolar 1 disorder   . Tobacco abuse   . Alcohol abuse   . Atrial flutter with rapid ventricular response 03/27/2014    unknown duration, newly diagnosed 03/2014    ROS:   All systems reviewed and negative except as noted in the HPI.   Past Surgical History  Procedure Laterality Date  . Arm fracture      when patient was young  . Atrial flutter ablation N/A 03/30/2014    Procedure: ATRIAL FLUTTER ABLATION;  Surgeon: [name], MD;  Location: [facility] CATH LAB;  Service: Cardiovascular;  Laterality: N/A;  . Tee without cardioversion N/A 03/30/2014    Procedure: TRANSESOPHAGEAL ECHOCARDIOGRAM (TEE);  Surgeon: [name], MD;  Location: [facility] ENDOSCOPY;  Service: Cardiovascular;  Laterality: N/A;     Family History  Problem Relation Age of Onset  . Hypertension Mother   . Heart attack Mother 78  . Heart disease Mother   . Lung cancer Father     heavy smoker     History   Social History   . Marital Status: Single    Spouse Name: N/A  . Number of Children: N/A  . Years of Education: N/A   Occupational History  . janitor    Social History Main Topics  . Smoking status: Current Every Day Smoker  . Smokeless tobacco: Not on file     Comment: 1.5 pack per day since high school  . Alcohol Use: 0.0 oz/week    0 Standard drinks or equivalent per week     Comment: 2x 40 oz beers daily  . Drug Use: Yes     Comment: occasional marijuana use  . Sexual Activity: Not on file   Other Topics Concern  . Not on file   Social History Narrative     BP 124/86 mmHg  Pulse 63  Ht 5\' 8"  (1.727 m)  Wt 171 lb 3.2 oz (77.656 kg)  BMI 26.04 kg/m2  Physical Exam:  stable appearing 49 yo man, looking older than his stated age, NAD HEENT: Unremarkable Neck:  No JVD, no thyromegally Back:  No CVA tenderness Lungs:  Clear with no wheezes HEART:  Regular rate rhythm, no murmurs, no rubs, no clicks Abd:  soft, positive bowel sounds, no organomegally, no rebound, no guarding Ext:  2 plus pulses, no edema, no cyanosis, no clubbing Skin:  No  rashes no nodules Neuro:  CN II through XII intact, motor grossly intact  EKG - NSR  Assess/Plan:

## 2014-07-17 NOTE — Assessment & Plan Note (Signed)
His blood pressure is normal today. Will follow.

## 2014-09-24 ENCOUNTER — Other Ambulatory Visit: Payer: Self-pay

## 2014-09-24 MED ORDER — CARVEDILOL 3.125 MG PO TABS: 3.1250 mg | ORAL_TABLET | Freq: Two times a day (BID) | ORAL | Status: DC

## 2019-04-20 ENCOUNTER — Inpatient Hospital Stay (HOSPITAL_COMMUNITY)
Admission: EM | Admit: 2019-04-20 | Discharge: 2019-04-22 | DRG: 281 | Disposition: A | Discharge status: Home / Self Care | Payer: Self-pay | Attending: Internal Medicine | Admitting: Internal Medicine

## 2019-04-20 ENCOUNTER — Other Ambulatory Visit: Payer: Self-pay

## 2019-04-20 ENCOUNTER — Emergency Department (HOSPITAL_COMMUNITY): Payer: Self-pay

## 2019-04-20 ENCOUNTER — Encounter (HOSPITAL_COMMUNITY): Payer: Self-pay | Admitting: Emergency Medicine

## 2019-04-20 DIAGNOSIS — T447X6A Underdosing of beta-adrenoreceptor antagonists, initial encounter: Secondary | ICD-10-CM | POA: Diagnosis present

## 2019-04-20 DIAGNOSIS — I214 Non-ST elevation (NSTEMI) myocardial infarction: Principal | ICD-10-CM | POA: Diagnosis present

## 2019-04-20 DIAGNOSIS — Z7982 Long term (current) use of aspirin: Secondary | ICD-10-CM

## 2019-04-20 DIAGNOSIS — F319 Bipolar disorder, unspecified: Secondary | ICD-10-CM | POA: Diagnosis present

## 2019-04-20 DIAGNOSIS — Z9114 Patient's other noncompliance with medication regimen: Secondary | ICD-10-CM

## 2019-04-20 DIAGNOSIS — I249 Acute ischemic heart disease, unspecified: Secondary | ICD-10-CM

## 2019-04-20 DIAGNOSIS — I10 Essential (primary) hypertension: Secondary | ICD-10-CM | POA: Diagnosis present

## 2019-04-20 DIAGNOSIS — I2510 Atherosclerotic heart disease of native coronary artery without angina pectoris: Secondary | ICD-10-CM | POA: Diagnosis present

## 2019-04-20 DIAGNOSIS — Z79899 Other long term (current) drug therapy: Secondary | ICD-10-CM

## 2019-04-20 DIAGNOSIS — I4891 Unspecified atrial fibrillation: Secondary | ICD-10-CM | POA: Diagnosis present

## 2019-04-20 DIAGNOSIS — R0602 Shortness of breath: Secondary | ICD-10-CM

## 2019-04-20 DIAGNOSIS — Z91128 Patient's intentional underdosing of medication regimen for other reason: Secondary | ICD-10-CM

## 2019-04-20 DIAGNOSIS — F1020 Alcohol dependence, uncomplicated: Secondary | ICD-10-CM | POA: Diagnosis present

## 2019-04-20 DIAGNOSIS — I429 Cardiomyopathy, unspecified: Secondary | ICD-10-CM | POA: Diagnosis present

## 2019-04-20 DIAGNOSIS — Z8249 Family history of ischemic heart disease and other diseases of the circulatory system: Secondary | ICD-10-CM

## 2019-04-20 DIAGNOSIS — T464X6A Underdosing of angiotensin-converting-enzyme inhibitors, initial encounter: Secondary | ICD-10-CM | POA: Diagnosis present

## 2019-04-20 DIAGNOSIS — F17210 Nicotine dependence, cigarettes, uncomplicated: Secondary | ICD-10-CM | POA: Diagnosis present

## 2019-04-20 DIAGNOSIS — Y92009 Unspecified place in unspecified non-institutional (private) residence as the place of occurrence of the external cause: Secondary | ICD-10-CM

## 2019-04-20 DIAGNOSIS — Z20822 Contact with and (suspected) exposure to covid-19: Secondary | ICD-10-CM | POA: Diagnosis present

## 2019-04-20 DIAGNOSIS — E785 Hyperlipidemia, unspecified: Secondary | ICD-10-CM | POA: Diagnosis present

## 2019-04-20 HISTORY — DX: Acute ischemic heart disease, unspecified: I24.9

## 2019-04-20 LAB — CBC WITH DIFFERENTIAL/PLATELET
Abs Immature Granulocytes: 0.04 10*3/uL (ref 0.00–0.07)
Basophils Absolute: 0.1 10*3/uL (ref 0.0–0.1)
Basophils Relative: 1 %
Eosinophils Absolute: 0 10*3/uL (ref 0.0–0.5)
Eosinophils Relative: 0 %
HCT: 51.9 % (ref 39.0–52.0)
Hemoglobin: 17.6 g/dL — ABNORMAL HIGH (ref 13.0–17.0)
Immature Granulocytes: 0 %
Lymphocytes Relative: 22 %
Lymphs Abs: 2.3 10*3/uL (ref 0.7–4.0)
MCH: 34.4 pg — ABNORMAL HIGH (ref 26.0–34.0)
MCHC: 33.9 g/dL (ref 30.0–36.0)
MCV: 101.6 fL — ABNORMAL HIGH (ref 80.0–100.0)
Monocytes Absolute: 0.6 10*3/uL (ref 0.1–1.0)
Monocytes Relative: 6 %
Neutro Abs: 7.2 10*3/uL (ref 1.7–7.7)
Neutrophils Relative %: 71 %
Platelets: 253 10*3/uL (ref 150–400)
RBC: 5.11 MIL/uL (ref 4.22–5.81)
RDW: 12.6 % (ref 11.5–15.5)
WBC: 10.3 10*3/uL (ref 4.0–10.5)
nRBC: 0 % (ref 0.0–0.2)

## 2019-04-20 LAB — COMPREHENSIVE METABOLIC PANEL
ALT: 61 U/L — ABNORMAL HIGH (ref 0–44)
AST: 31 U/L (ref 15–41)
Albumin: 4 g/dL (ref 3.5–5.0)
Alkaline Phosphatase: 66 U/L (ref 38–126)
Anion gap: 12 (ref 5–15)
BUN: 9 mg/dL (ref 6–20)
CO2: 21 mmol/L — ABNORMAL LOW (ref 22–32)
Calcium: 9.3 mg/dL (ref 8.9–10.3)
Chloride: 106 mmol/L (ref 98–111)
Creatinine, Ser: 0.95 mg/dL (ref 0.61–1.24)
GFR calc Af Amer: >60 mL/min (ref >60)
GFR calc non Af Amer: >60 mL/min (ref >60)
Glucose, Bld: 94 mg/dL (ref 70–99)
Potassium: 3.8 mmol/L (ref 3.5–5.1)
Sodium: 139 mmol/L (ref 135–145)
Total Bilirubin: 1.6 mg/dL — ABNORMAL HIGH (ref 0.3–1.2)
Total Protein: 7.5 g/dL (ref 6.5–8.1)

## 2019-04-20 LAB — TROPONIN I (HIGH SENSITIVITY)
Troponin I (High Sensitivity): 87 ng/L — ABNORMAL HIGH (ref <18)
Troponin I (High Sensitivity): 121 ng/L (ref <18)

## 2019-04-20 LAB — BASIC METABOLIC PANEL
Anion gap: 10 (ref 5–15)
BUN: 8 mg/dL (ref 6–20)
CO2: 21 mmol/L — ABNORMAL LOW (ref 22–32)
Calcium: 9.3 mg/dL (ref 8.9–10.3)
Chloride: 107 mmol/L (ref 98–111)
Creatinine, Ser: 1.05 mg/dL (ref 0.61–1.24)
GFR calc Af Amer: >60 mL/min (ref >60)
GFR calc non Af Amer: >60 mL/min (ref >60)
Glucose, Bld: 128 mg/dL — ABNORMAL HIGH (ref 70–99)
Potassium: 4.3 mmol/L (ref 3.5–5.1)
Sodium: 138 mmol/L (ref 135–145)

## 2019-04-20 LAB — CBC
HCT: 51.5 % (ref 39.0–52.0)
Hemoglobin: 17.4 g/dL — ABNORMAL HIGH (ref 13.0–17.0)
MCH: 34.3 pg — ABNORMAL HIGH (ref 26.0–34.0)
MCHC: 33.8 g/dL (ref 30.0–36.0)
MCV: 101.4 fL — ABNORMAL HIGH (ref 80.0–100.0)
Platelets: 269 10*3/uL (ref 150–400)
RBC: 5.08 MIL/uL (ref 4.22–5.81)
RDW: 12.7 % (ref 11.5–15.5)
WBC: 9.4 10*3/uL (ref 4.0–10.5)
nRBC: 0 % (ref 0.0–0.2)

## 2019-04-20 LAB — PROTIME-INR
INR: 1 (ref 0.8–1.2)
Prothrombin Time: 12.7 seconds (ref 11.4–15.2)

## 2019-04-20 LAB — SARS CORONAVIRUS 2 (TAT 6-24 HRS): SARS Coronavirus 2: NEGATIVE

## 2019-04-20 LAB — D-DIMER, QUANTITATIVE (NOT AT ARMC): D-Dimer, Quant: 0.57 ug/mL-FEU — ABNORMAL HIGH (ref 0.00–0.50)

## 2019-04-20 LAB — APTT: aPTT: 29 seconds (ref 24–36)

## 2019-04-20 LAB — MAGNESIUM: Magnesium: 2 mg/dL (ref 1.7–2.4)

## 2019-04-20 LAB — PHOSPHORUS: Phosphorus: 3.8 mg/dL (ref 2.5–4.6)

## 2019-04-20 LAB — HEPARIN LEVEL (UNFRACTIONATED): Heparin Unfractionated: <0.1 IU/mL — ABNORMAL LOW (ref 0.30–0.70)

## 2019-04-20 MED ORDER — SORBITOL 70 % SOLN: 30.0000 mL | Freq: Every day | Status: DC | PRN

## 2019-04-20 MED ORDER — FOLIC ACID 1 MG PO TABS
1.0000 mg | ORAL_TABLET | Freq: Every day | ORAL | Status: DC
  Administered 2019-04-20 – 2019-04-22 (×2): 1 mg via ORAL
  Filled 2019-04-20 (×2): qty 1

## 2019-04-20 MED ORDER — MAGNESIUM HYDROXIDE 400 MG/5ML PO SUSP: 30.0000 mL | Freq: Every day | ORAL | Status: DC | PRN

## 2019-04-20 MED ORDER — ONDANSETRON HCL 4 MG/2ML IJ SOLN: 4.0000 mg | Freq: Four times a day (QID) | INTRAMUSCULAR | Status: DC | PRN

## 2019-04-20 MED ORDER — LORAZEPAM 1 MG PO TABS
1.0000 mg | ORAL_TABLET | ORAL | Status: DC | PRN
  Administered 2019-04-22: 1 mg via ORAL
  Filled 2019-04-20: qty 1

## 2019-04-20 MED ORDER — SENNA 8.6 MG PO TABS
1.0000 | ORAL_TABLET | Freq: Two times a day (BID) | ORAL | Status: DC
  Administered 2019-04-22: 10:00:00 8.6 mg via ORAL
  Filled 2019-04-20 (×2): qty 1

## 2019-04-20 MED ORDER — LORAZEPAM 1 MG PO TABS: 0.0000 mg | ORAL_TABLET | Freq: Three times a day (TID) | ORAL | Status: DC

## 2019-04-20 MED ORDER — LORAZEPAM 1 MG PO TABS
0.0000 mg | ORAL_TABLET | ORAL | Status: DC
  Administered 2019-04-21: 1 mg via ORAL
  Administered 2019-04-21: 22:00:00 2 mg via ORAL
  Administered 2019-04-21: 02:00:00 1 mg via ORAL
  Filled 2019-04-20: qty 1
  Filled 2019-04-20: qty 2
  Filled 2019-04-20: qty 1

## 2019-04-20 MED ORDER — ONDANSETRON HCL 4 MG PO TABS: 4.0000 mg | ORAL_TABLET | Freq: Four times a day (QID) | ORAL | Status: DC | PRN

## 2019-04-20 MED ORDER — ENOXAPARIN SODIUM 40 MG/0.4ML ~~LOC~~ SOLN: 40.0000 mg | SUBCUTANEOUS | Status: DC

## 2019-04-20 MED ORDER — LORAZEPAM 2 MG/ML IJ SOLN: 1.0000 mg | INTRAMUSCULAR | Status: DC | PRN

## 2019-04-20 MED ORDER — AMLODIPINE BESYLATE 5 MG PO TABS
5.0000 mg | ORAL_TABLET | Freq: Once | ORAL | Status: AC
  Administered 2019-04-20: 17:00:00 5 mg via ORAL
  Filled 2019-04-20: qty 1

## 2019-04-20 MED ORDER — ACETAMINOPHEN 325 MG PO TABS
650.0000 mg | ORAL_TABLET | Freq: Four times a day (QID) | ORAL | Status: DC | PRN
  Administered 2019-04-21: 650 mg via ORAL
  Filled 2019-04-20: qty 2

## 2019-04-20 MED ORDER — ADULT MULTIVITAMIN W/MINERALS CH
1.0000 | ORAL_TABLET | Freq: Every day | ORAL | Status: DC
  Administered 2019-04-20 – 2019-04-22 (×2): 1 via ORAL
  Filled 2019-04-20 (×2): qty 1

## 2019-04-20 MED ORDER — THIAMINE HCL 100 MG PO TABS
100.0000 mg | ORAL_TABLET | Freq: Every day | ORAL | Status: DC
  Administered 2019-04-20 – 2019-04-22 (×2): 100 mg via ORAL
  Filled 2019-04-20 (×2): qty 1

## 2019-04-20 MED ORDER — NICOTINE 21 MG/24HR TD PT24
21.0000 mg | MEDICATED_PATCH | Freq: Every day | TRANSDERMAL | Status: DC
  Administered 2019-04-20 – 2019-04-22 (×3): 21 mg via TRANSDERMAL
  Filled 2019-04-20 (×3): qty 1

## 2019-04-20 MED ORDER — HEPARIN (PORCINE) 25000 UT/250ML-% IV SOLN
1350.0000 [IU]/h | INTRAVENOUS | Status: DC
  Administered 2019-04-20: 1100 [IU]/h via INTRAVENOUS
  Filled 2019-04-20: qty 250

## 2019-04-20 MED ORDER — FLEET ENEMA 7-19 GM/118ML RE ENEM: 1.0000 | ENEMA | Freq: Once | RECTAL | Status: DC | PRN

## 2019-04-20 MED ORDER — SODIUM CHLORIDE 0.9% FLUSH
3.0000 mL | Freq: Two times a day (BID) | INTRAVENOUS | Status: DC
  Administered 2019-04-20 – 2019-04-22 (×3): 3 mL via INTRAVENOUS

## 2019-04-20 MED ORDER — ACETAMINOPHEN 650 MG RE SUPP: 650.0000 mg | Freq: Four times a day (QID) | RECTAL | Status: DC | PRN

## 2019-04-20 MED ORDER — THIAMINE HCL 100 MG/ML IJ SOLN
100.0000 mg | Freq: Every day | INTRAMUSCULAR | Status: DC
  Filled 2019-04-20: qty 2

## 2019-04-20 NOTE — ED Triage Notes (Signed)
Pt reports that he was at work on Tuesday and got SOB with exertion so he left work and went home and slept and then felt better. Reports that he talked to his room mate's doctor who advised to go be evaluated. Reports doesn't have insurance to see a medical provider.

## 2019-04-20 NOTE — ED Notes (Addendum)
Date and time results received: 04/20/19 3:18 PM   Test: Troponin Critical Value: 121  Name of Provider Notified: Johana PA  Orders Received? Or Actions Taken?: PA made aware

## 2019-04-20 NOTE — ED Notes (Signed)
XR at bedside

## 2019-04-20 NOTE — ED Notes (Signed)
Hospitalist at bedside 

## 2019-04-20 NOTE — ED Provider Notes (Signed)
[place] DEPT Provider Note   CSN: [id number] Arrival date & time: 04/20/19  1112     History Chief Complaint  Patient presents with  . Shortness of Breath    Rodney Black is a 54 y.o. male.  54 y.o male with a PMH of Alcohol abuse, Atrial flutter with rapid ventricular response resents to the ED with a chief complaint of sudden onset of ornis of breath on exertion.  Patient is currently employed at [employer], reports he was picking up boxes and placing them on shelves when he had a sudden episode of shortness of breath.  This was resolved with going home and lying down.  He has not been seen by a PCP in about 5 years, reports he does have a prior history of atrial flutter and has been taking 2 aspirins daily but there has been no follow up for this condition according to patient.  Of note, patient does report marijuana use along with alcohol use, reports he drinks about three 40oz beers daily.   The history is provided by the patient.  Shortness of Breath Severity:  Moderate Onset quality:  Sudden Duration:  5 minutes Timing:  Intermittent Progression:  Resolved Chronicity:  New Context: activity and smoke exposure   Relieved by:  Lying down Worsened by:  Activity and exertion Ineffective treatments:  None tried Associated symptoms: chest pain   Associated symptoms: no abdominal pain, no fever, no headaches and no vomiting   Risk factors: alcohol use and tobacco use   Risk factors: no family hx of DVT and no hx of cancer        Past Medical History:  Diagnosis Date  . Alcohol abuse   . Atrial flutter with rapid ventricular response ([place]) 03/27/2014   unknown duration, newly diagnosed 03/2014  . Bipolar 1 disorder ([place])   . Manic depression ([place])   . Tobacco abuse     Patient Active Problem List   Diagnosis Date Noted  . Essential hypertension 04/05/2014  . Mood disorder ([place]) 04/05/2014  . Atrial flutter with rapid ventricular  response ([place]) 03/27/2014  . Tobacco abuse 03/27/2014  . Alcohol dependence ([place]) 03/27/2014  . A-fib ([place]) 03/27/2014    Past Surgical History:  Procedure Laterality Date  . arm fracture     when patient was young  . ATRIAL FLUTTER ABLATION N/A 03/30/2014   Procedure: ATRIAL FLUTTER ABLATION;  Surgeon: Evans Lance, MD;  Location: [facility] CATH LAB;  Service: Cardiovascular;  Laterality: N/A;  . TEE WITHOUT CARDIOVERSION N/A 03/30/2014   Procedure: TRANSESOPHAGEAL ECHOCARDIOGRAM (TEE);  Surgeon: Larey Dresser, MD;  Location[facility] ENDOSCOPY;  Service: Cardiovascular;  Laterality: N/A;       Family History  Problem Relation Age of Onset  . Hypertension Mother   . Heart attack Mother 63  . Heart disease Mother   . Lung cancer Father        heavy smoker    Social History   Tobacco Use  . Smoking status: Current Every Day Smoker  . Tobacco comment: 1.5 pack per day since high school  Substance Use Topics  . Alcohol use: Yes    Alcohol/week: 0.0 standard drinks    Comment: 2x 40 oz beers daily  . Drug use: Yes    Comment: occasional marijuana use    Home Medications Prior to Admission medications   Medication Sig Start Date End Date Taking? Authorizing Provider  aspirin 325 MG tablet Take 325 mg by  mouth daily.    [provider]  carbamazepine (TEGRETOL) 200 MG tablet Take 400 mg by mouth at bedtime.    [provider]  carvedilol (COREG) 3.125 MG tablet Take 1 tablet (3.125 mg total) by mouth 2 (two) times daily with a meal. 09/24/14   [name], MD  lisinopril (PRINIVIL,ZESTRIL) 5 MG tablet Take 1 tablet (5 mg total) by mouth daily. 05/29/14   Rosalio Macadamia, NP  lithium carbonate 300 MG capsule Take 1,200 mg by mouth at bedtime.    [provider]    Allergies    Patient has no known allergies.  Review of Systems   Review of Systems  Constitutional: Negative for fever.  HENT: Negative for sinus pressure and sinus pain.   Respiratory:  Positive for shortness of breath. Negative for chest tightness.   Cardiovascular: Positive for chest pain. Negative for palpitations and leg swelling.  Gastrointestinal: Negative for abdominal pain, nausea and vomiting.  Genitourinary: Negative for flank pain.  Musculoskeletal: Negative for back pain.  Neurological: Negative for syncope and headaches.  All other systems reviewed and are negative.   Physical Exam Updated Vital Signs BP (!) 142/102   Pulse 76   Temp 98.5 F (36.9 C)   Resp 17   SpO2 99%   Physical Exam Vitals and nursing note reviewed.  Constitutional:      Appearance: He is well-developed. He is not ill-appearing.     Comments: Disheveled  HENT:     Head: Normocephalic and atraumatic.  Eyes:     General: No scleral icterus.    Pupils: Pupils are equal, round, and reactive to light.  Cardiovascular:     Heart sounds: Normal heart sounds.     Comments: Bilateral pitting edema.  No calf tenderness. Pulmonary:     Effort: Pulmonary effort is normal. No tachypnea or bradypnea.     Breath sounds: Rhonchi present. No wheezing.  Chest:     Chest wall: No tenderness.  Abdominal:     General: Bowel sounds are normal. There is no distension.     Palpations: Abdomen is soft.     Tenderness: There is no abdominal tenderness.  Musculoskeletal:        General: No tenderness or deformity.     Cervical back: Normal range of motion.  Skin:    General: Skin is warm and dry.  Neurological:     Mental Status: He is alert and oriented to person, place, and time.     ED Results / Procedures / Treatments   Labs (all labs ordered are listed, but only abnormal results are displayed) Labs Reviewed  BASIC METABOLIC PANEL - Abnormal; Notable for the following components:      Result Value   CO2 21 (*)    Glucose, Bld 128 (*)    All other components within normal limits  CBC WITH DIFFERENTIAL/PLATELET - Abnormal; Notable for the following components:   Hemoglobin 17.6  (*)    MCV 101.6 (*)    MCH 34.4 (*)    All other components within normal limits  D-DIMER, QUANTITATIVE (NOT AT [facility]) - Abnormal; Notable for the following components:   D-Dimer, Quant 0.57 (*)    All other components within normal limits  TROPONIN I (HIGH SENSITIVITY) - Abnormal; Notable for the following components:   Troponin I (High Sensitivity) 87 (*)    All other components within normal limits  TROPONIN I (HIGH SENSITIVITY) - Abnormal; Notable for the following components:  Troponin I (High Sensitivity) 121 (*)    All other components within normal limits    EKG EKG Interpretation  Date/Time:  Thursday April 20 2019 11:28:08 EST Ventricular Rate:  107 PR Interval:    QRS Duration: 84 QT Interval:  320 QTC Calculation: 427 R Axis:   70 Text Interpretation: Sinus tachycardia Otherwise normal ECG Confirmed by Margarita Grizzle [phone]) on 04/20/2019 11:41:55 AM   Radiology DG Chest Portable 1 View  Result Date: 04/20/2019 CLINICAL DATA:  Shortness of breath. EXAM: PORTABLE CHEST 1 VIEW COMPARISON:  03/27/2014 FINDINGS: 11:44 a.m. No edema or pleural effusion. Streaky opacity at the bases is compatible with atelectasis or infiltrate. The cardiopericardial silhouette is within normal limits for size. The visualized bony structures of the thorax are intact. IMPRESSION: Bibasilar atelectasis or infiltrate. Electronically Signed   By: Kennith Center M.D.   On: 04/20/2019 12:32    Procedures Procedures (including critical care time)  Medications Ordered in ED Medications - No data to display  ED Course  I have reviewed the triage vital signs and the nursing notes.  Pertinent labs & imaging results that were available during my care of the patient were reviewed by me and considered in my medical decision making (see chart for details).    MDM Rules/Calculators/A&P   Patient currently noncompliant with any medications presents to the ED with complaints of sudden onset of  shortness of breath 2 days ago while at work, this was relieved with rest.  He does have a prior history of atrial flutter, currently not on any medications for this condition.  According to patient he did not develop chest pain however has felt somewhat rundown, blood pressure has been running high since the incident.  Patient reports he has not followed by a primary care in several years.  According to his records which have extensively reviewed patient did have an echo in 2016 which revealed an EF of 55-60%  X-ray with some bilateral infiltrates, some suspicion for infectious versus elective cyst.  Interpretation of his labs revealed a CBC without any leukocytosis, he is well concentrated, suspect this is due to his 1-1/2 pack a day smoking.  BMP without any electrolyte derangement, glucose level is elevated at 128, no anion gap, no prior history of diabetes.  First troponin was 87.  Place cardiology consult for further recommendations.  Spoke to cardiology team Dr. Lezlie Lye who amended hospitalist admission, they will follow if need be.  HEART SCORE 6  I have discussed patient with my attending Dr. Shirlee Limerick who has also evaluated patient and agrees with plan and management.  Spoke to hospitalist service who will admit patient for further care.  Portions of this note were generated with Scientist, clinical (histocompatibility and immunogenetics). Dictation errors may occur despite best attempts at proofreading.  Final Clinical Impression(s) / ED Diagnoses Final diagnoses:  SOB (shortness of breath)  NSTEMI (non-ST elevated myocardial infarction) [facility])    Rx / DC Orders ED Discharge Orders    None       Claude Manges, PA-C 04/20/19 1542    Jacalyn Lefevre, MD 04/20/19 1626

## 2019-04-20 NOTE — Progress Notes (Signed)
ANTICOAGULATION CONSULT NOTE - Initial Consult  Pharmacy Consult for heparin Indication: ACS/NSTEMI  No Known Allergies  Patient Measurements: Height: 5\' 8"  (172.7 cm) Weight: 220 lb (99.8 kg) IBW/kg (Calculated) : 68.4 Heparin Dosing Weight: 89.8 kg  Vital Signs: Temp: 98.5 F (36.9 C) (02/25 1136) BP: 159/109 (02/25 1641) Pulse Rate: 73 (02/25 1640)  Labs: Recent Labs    04/20/19 1203 04/20/19 1354  HGB 17.6*  --   HCT 51.9  --   PLT 253  --   CREATININE 1.05  --   TROPONINIHS 87* 121*    Estimated Creatinine Clearance: 93.2 mL/min (by C-G formula based on SCr of 1.05 mg/dL).   Medical History: Past Medical History:  Diagnosis Date  . Alcohol abuse   . Atrial flutter with rapid ventricular response ([facility]) 03/27/2014   unknown duration, newly diagnosed 03/2014  . Bipolar 1 disorder ([facility])   . Manic depression ([facility])   . Tobacco abuse     Assessment: 54 y.o male with PMH of Alcohol abuse, Atrial flutter with RVR presented to the ED with a chief complaint of sudden onset of shortness of breath on exertion. He reports that he takes 2 aspirin daily. Troponin elevated to 121, CBC WNL. Pharmacy has been consulted for IV heparin without bolus per MD.   Goal of Therapy:  Heparin level 0.3-0.7 units/ml Monitor platelets by anticoagulation protocol: Yes   Plan:  Baseline aPTT and PT/INR Initiate heparin drip at 1100 units/hour Check heparin level 6 hours after the start of the infusion  Monitor daily heparin levels, CBC, s/s of bleed   40, PharmD 04/20/2019,4:43 PM

## 2019-04-20 NOTE — H&P (Signed)
[facility] History and Physical  LEONID MANUS KDT:[id number] DOB: September 24, 1965 DOA: 04/20/2019 PCP: Patient, [name]  Patient coming from: Home Chief Complaint: Shortness of breath  History of Present Illness: Rodney Black is a 54 y.o. male with PMH of A. fib status post ablation, alcoholism, chronic tobacco use, bipolar disorder. Patient presented to the ED today with complaint of shortness of breath with exertion that happened 2 days ago. Patient works at [employer]. 2 days ago, while lifting a heavy bucket, patient felt acutely short of breath. He was let to go home and rest. He felt fine later and he stayed home for another 2 days. He said he talked to his roommates doctor who advised him to go to ED for evaluation. Patient smokes around one and a half pack of cigarettes a day He drinks about 3 bottles of 40 ounces of beer a day. He states he was hospitalized 5 years ago for similar symptoms and was noted to have atrial flutter. He underwent ablation. He said he also underwent cardiac cath at that time and did not have any stenosis. He took some medicine to control his heart rate for about 2 years and he self stopped. He has been taking 2 baby aspirins a day myself resident. He has not seen a physician in last few years. He does not have medical insurance.   In the ED, patient was afebrile, heart rate 103, blood pressure elevated up to 163/114, breathing comfortably on room air Labs showed unremarkable CBC and BMP, troponin was 87 and subsequently increased to 121. EKG showed sinus tachycardia at 107/min, QTC 06/13/1975, no ST-T wave changes.  Hospitalist service was called for inpatient admission and management. The time of my evaluation, patient was sitting up in chair. Not in distress. He denies any chest pain or shortness of breath today.  Review of Systems:  All systems were reviewed and were negative unless otherwise mentioned in the HPI   Past medical  history: Past Medical History:  Diagnosis Date  . Alcohol abuse   . Atrial flutter with rapid ventricular response (HCC) 03/27/2014   unknown duration, newly diagnosed 03/2014  . Bipolar 1 disorder (HCC)   . Manic depression (HCC)   . Tobacco abuse     Past surgical history: Past Surgical History:  Procedure Laterality Date  . arm fracture     when patient was young  . ATRIAL FLUTTER ABLATION N/A 03/30/2014   Procedure: ATRIAL FLUTTER ABLATION;  Surgeon: [name], MD;  Location[facility] CATH LAB;  Service: Cardiovascular;  Laterality: N/A;  . TEE WITHOUT CARDIOVERSION N/A 03/30/2014   Procedure: TRANSESOPHAGEAL ECHOCARDIOGRAM (TEE);  Surgeon: [name], MD;  Location: [facility] ENDOSCOPY;  Service: Cardiovascular;  Laterality: N/A;    Social History:  reports that he has been smoking. He has never used smokeless tobacco. He reports current alcohol use. He reports current drug use.  Allergies:  No Known Allergies  Family history:  Family History  Problem Relation Age of Onset  . Hypertension Mother   . Heart attack Mother 36  . Heart disease Mother   . Lung cancer Father        heavy smoker     Home Meds: Prior to Admission medications   Medication Sig Start Date End Date Taking? Authorizing Provider  aspirin 325 MG tablet Take 325 mg by mouth 3 (three) times daily as needed for mild pain.    Yes [provider]  carvedilol (COREG) 3.125  MG tablet Take 1 tablet (3.125 mg total) by mouth 2 (two) times daily with a meal. Patient not taking: Reported on 04/20/2019 09/24/14   Evans Lance, MD  lisinopril (PRINIVIL,ZESTRIL) 5 MG tablet Take 1 tablet (5 mg total) by mouth daily. Patient not taking: Reported on 04/20/2019 05/29/14   Burtis Junes, NP    Physical Exam: Vitals:   04/20/19 1554 04/20/19 1618 04/20/19 1640 04/20/19 1641  BP: (!) 145/104  (!) 159/109 (!) 159/109  Pulse: 64  73   Resp: 18  16   Temp:      SpO2: 100%  100%   Weight:  99.8 kg    Height:  5'  8" (1.727 m)     Wt Readings from Last 3 Encounters:  04/20/19 99.8 kg  07/17/14 77.7 kg  05/29/14 76.2 kg   Body mass index is 33.45 kg/m.  General exam: Appears calm and comfortable.  Skin: No rashes, lesions or ulcers. HEENT: Atraumatic, normocephalic, supple neck, no obvious bleeding. Poor oral hygiene. Lungs:  mild end expiratory wheezing scattered CVS: Regular rate and rhythm, no murmur GI/Abd soft, nontender, nondistended, bowel sound present CNS: Alert, awake, oriented x3 Psychiatry: Mood appropriate Extremities: No pedal edema, no calf tenderness     Consult Orders  (From admission, onward)         Start     Ordered   04/20/19 1526  Consult to hospitalist  ALL PATIENTS BEING ADMITTED/HAVING PROCEDURES NEED COVID-19 SCREENING  Once    Comments: ALL PATIENTS BEING ADMITTED/HAVING PROCEDURES NEED COVID-19 SCREENING  Provider:  (Not yet assigned)  Question Answer Comment  Place call to: [facility]   Reason for Consult Admit      04/20/19 1525          Labs on Admission:   CBC: Recent Labs  Lab 04/20/19 1203  WBC 10.3  NEUTROABS 7.2  HGB 17.6*  HCT 51.9  MCV 101.6*  PLT 433    Basic Metabolic Panel: Recent Labs  Lab 04/20/19 1203  NA 138  K 4.3  CL 107  CO2 21*  GLUCOSE 128*  BUN 8  CREATININE 1.05  CALCIUM 9.3    Liver Function Tests: No results for input(s): AST, ALT, ALKPHOS, BILITOT, PROT, ALBUMIN in the last 168 hours. No results for input(s): LIPASE, AMYLASE in the last 168 hours. No results for input(s): AMMONIA in the last 168 hours.  Cardiac Enzymes: No results for input(s): CKTOTAL, CKMB, CKMBINDEX, TROPONINI in the last 168 hours.  BNP (last 3 results) No results for input(s): BNP in the last 8760 hours.  ProBNP (last 3 results) No results for input(s): PROBNP in the last 8760 hours.  CBG: No results for input(s): GLUCAP in the last 168 hours.  Lipase  No results found for: LIPASE   Urinalysis     Component Value Date/Time   COLORURINE YELLOW 03/29/2014 [place] 03/29/2014 1613   LABSPEC 1.010 03/29/2014 1613   PHURINE 6.5 03/29/2014 1613   GLUCOSEU NEGATIVE 03/29/2014 1613   HGBUR NEGATIVE 03/29/2014 1613   BILIRUBINUR NEGATIVE 03/29/2014 1613   KETONESUR NEGATIVE 03/29/2014 1613   PROTEINUR NEGATIVE 03/29/2014 1613   UROBILINOGEN 0.2 03/29/2014 1613   NITRITE NEGATIVE 03/29/2014 1613   LEUKOCYTESUR NEGATIVE 03/29/2014 1613     Drugs of Abuse     Component Value Date/Time   LABOPIA NONE DETECTED 03/27/2014 1634   COCAINSCRNUR NONE DETECTED 03/27/2014 1634   LABBENZ NONE DETECTED 03/27/2014 1634   AMPHETMU  NONE DETECTED 03/27/2014 1634   THCU POSITIVE (A) 03/27/2014 1634   LABBARB NONE DETECTED 03/27/2014 1634      Radiological Exams on Admission: DG Chest Portable 1 View  Result Date: 04/20/2019 CLINICAL DATA:  Shortness of breath. EXAM: PORTABLE CHEST 1 VIEW COMPARISON:  03/27/2014 FINDINGS: 11:44 a.m. No edema or pleural effusion. Streaky opacity at the bases is compatible with atelectasis or infiltrate. The cardiopericardial silhouette is within normal limits for size. The visualized bony structures of the thorax are intact. IMPRESSION: Bibasilar atelectasis or infiltrate. Electronically Signed   By: Kennith Center M.D.   On: 04/20/2019 12:32     ------------------------------------------------------------------------------------------------------ Assessment/Plan: Active Problems:   ACS (acute coronary syndrome) (HCC)  NSTEMI -Presented with shortness of breath on exertion. -Has significant risk factors for coronary disease including chronic smoking, chronic alcoholism, family history of heart disease, mother died at 19 with a massive heart attack -Troponin elevated at 33 initially and subsequently to 121. -Type I vs Type II NSTEMI. Because of risk factors, I would start the patient on heparin drip. Repeat troponin tomorrow. Obtain an  echocardiogram to note wall motion abnormality. -Discussed with cardiologist on-call Dr. Anne Fu.  Uncontrolled hypertension -Not on any blood pressure medicine at home. -Start on amlodipine 5 mg daily at this time. -Monitor blood pressure.  History of a flutter -Status post ablation. Currently normal sinus rhythm  Chronic alcoholism -Drinks 3 bottles of 40 ounces of alcohol. -Start on CIWA protocol with Ativan scheduled and as needed  Chronic daily smoker -Counseled to quit. Nicotine patch offered.  DVT prophylaxis:  Heparin drip Antimicrobials:  None Fluid: None Diet: Cardiac diet. N.p.o. after midnight just in case any procedure required  Code Status:  Full code Mobility: Encourage ambulation Family Communication:  None Discharge plan:  Anticipated date: Depends on cardiology input, troponin and echo findings  Consultants: Cardiology ----------------------------------------------------------------------------------------------------------------------------------------------------------- Severity of Illness: The appropriate patient status for this patient is INPATIENT. Inpatient status is judged to be reasonable and necessary in order to provide the required intensity of service to ensure the patient's safety. The patient's presenting symptoms, physical exam findings, and initial radiographic and laboratory data in the context of their chronic comorbidities is felt to place them at high risk for further clinical deterioration. Furthermore, it is not anticipated that the patient will be medically stable for discharge from the hospital within 2 midnights of admission. The following factors support the patient status of inpatient.   " The patient's presenting symptoms include shortness of breath. " The worrisome physical exam findings include elevated blood pressure. " The initial radiographic and laboratory data are worrisome because of elevated troponin. " The chronic  co-morbidities include chronic smoking, chronic alcoholism.   * I certify that at the point of admission it is my clinical judgment that the patient will require inpatient hospital care spanning beyond 2 midnights from the point of admission due to high intensity of service, high risk for further deterioration and high frequency of surveillance required.*   -----------------------------------------------------------------------------------------------------  Mack Hook, MD [facility] Pager: [phone] (Secure Chat preferred). 04/20/2019

## 2019-04-20 NOTE — ED Notes (Signed)
EDP at bedside  

## 2019-04-21 ENCOUNTER — Inpatient Hospital Stay (HOSPITAL_COMMUNITY)
Admit: 2019-04-21 | Discharge: 2019-04-21 | Disposition: A | Discharge status: Home / Self Care | Payer: Self-pay | Attending: Cardiology | Admitting: Cardiology

## 2019-04-21 ENCOUNTER — Telehealth: Payer: Self-pay

## 2019-04-21 ENCOUNTER — Inpatient Hospital Stay (HOSPITAL_COMMUNITY): Payer: Self-pay

## 2019-04-21 DIAGNOSIS — R079 Chest pain, unspecified: Secondary | ICD-10-CM

## 2019-04-21 DIAGNOSIS — I248 Other forms of acute ischemic heart disease: Secondary | ICD-10-CM

## 2019-04-21 LAB — LIPID PANEL
Cholesterol: 206 mg/dL — ABNORMAL HIGH (ref 0–200)
HDL: 40 mg/dL — ABNORMAL LOW (ref >40)
LDL Cholesterol: 140 mg/dL — ABNORMAL HIGH (ref 0–99)
Total CHOL/HDL Ratio: 5.2 RATIO
Triglycerides: 132 mg/dL (ref <150)
VLDL: 26 mg/dL (ref 0–40)

## 2019-04-21 LAB — NM MYOCAR MULTI W/SPECT W/WALL MOTION / EF
MPHR: 167 {beats}/min
Peak HR: 104 {beats}/min
Percent HR: 62 %
Rest HR: 81 {beats}/min

## 2019-04-21 LAB — BASIC METABOLIC PANEL
Anion gap: 9 (ref 5–15)
BUN: 11 mg/dL (ref 6–20)
CO2: 23 mmol/L (ref 22–32)
Calcium: 9.3 mg/dL (ref 8.9–10.3)
Chloride: 107 mmol/L (ref 98–111)
Creatinine, Ser: 0.97 mg/dL (ref 0.61–1.24)
GFR calc Af Amer: >60 mL/min (ref >60)
GFR calc non Af Amer: >60 mL/min (ref >60)
Glucose, Bld: 105 mg/dL — ABNORMAL HIGH (ref 70–99)
Potassium: 4.4 mmol/L (ref 3.5–5.1)
Sodium: 139 mmol/L (ref 135–145)

## 2019-04-21 LAB — CBC
HCT: 51.6 % (ref 39.0–52.0)
Hemoglobin: 17.8 g/dL — ABNORMAL HIGH (ref 13.0–17.0)
MCH: 34.7 pg — ABNORMAL HIGH (ref 26.0–34.0)
MCHC: 34.5 g/dL (ref 30.0–36.0)
MCV: 100.6 fL — ABNORMAL HIGH (ref 80.0–100.0)
Platelets: 218 10*3/uL (ref 150–400)
RBC: 5.13 MIL/uL (ref 4.22–5.81)
RDW: 12.5 % (ref 11.5–15.5)
WBC: 7.7 10*3/uL (ref 4.0–10.5)
nRBC: 0 % (ref 0.0–0.2)

## 2019-04-21 LAB — HEPARIN LEVEL (UNFRACTIONATED)
Heparin Unfractionated: <0.1 IU/mL — ABNORMAL LOW (ref 0.30–0.70)
Heparin Unfractionated: 0.24 IU/mL — ABNORMAL LOW (ref 0.30–0.70)

## 2019-04-21 LAB — TROPONIN I (HIGH SENSITIVITY): Troponin I (High Sensitivity): 80 ng/L — ABNORMAL HIGH (ref <18)

## 2019-04-21 LAB — HEMOGLOBIN A1C
Hgb A1c MFr Bld: 5.9 % — ABNORMAL HIGH (ref 4.8–5.6)
Mean Plasma Glucose: 122.63 mg/dL

## 2019-04-21 LAB — HIV ANTIBODY (ROUTINE TESTING W REFLEX): HIV Screen 4th Generation wRfx: NONREACTIVE

## 2019-04-21 MED ORDER — ENOXAPARIN SODIUM 40 MG/0.4ML ~~LOC~~ SOLN
40.0000 mg | SUBCUTANEOUS | Status: DC
  Administered 2019-04-21: 22:00:00 40 mg via SUBCUTANEOUS
  Filled 2019-04-21: qty 0.4

## 2019-04-21 MED ORDER — TECHNETIUM TC 99M TETROFOSMIN IV KIT
10.6000 | PACK | Freq: Once | INTRAVENOUS | Status: AC | PRN
  Administered 2019-04-21: 10.6 via INTRAVENOUS

## 2019-04-21 MED ORDER — REGADENOSON 0.4 MG/5ML IV SOLN
INTRAVENOUS | Status: AC
  Filled 2019-04-21: qty 5

## 2019-04-21 MED ORDER — TECHNETIUM TC 99M TETROFOSMIN IV KIT
31.1000 | PACK | Freq: Once | INTRAVENOUS | Status: AC | PRN
  Administered 2019-04-21: 16:00:00 31.1 via INTRAVENOUS

## 2019-04-21 MED ORDER — REGADENOSON 0.4 MG/5ML IV SOLN
0.4000 mg | Freq: Once | INTRAVENOUS | Status: AC
  Administered 2019-04-21: 14:00:00 0.4 mg via INTRAVENOUS

## 2019-04-21 MED ORDER — CARVEDILOL 3.125 MG PO TABS
3.1250 mg | ORAL_TABLET | Freq: Two times a day (BID) | ORAL | Status: DC
  Administered 2019-04-21 – 2019-04-22 (×2): 3.125 mg via ORAL
  Filled 2019-04-21 (×2): qty 1

## 2019-04-21 NOTE — Progress Notes (Signed)
ANTICOAGULATION CONSULT NOTE - Follow Up Consult  Pharmacy Consult for Heparin Indication: chest pain/ACS  No Known Allergies  Patient Measurements: Height: 5\' 8"  (172.7 cm) Weight: 220 lb (99.8 kg) IBW/kg (Calculated) : 68.4 Heparin Dosing Weight:   Vital Signs: Temp: 97.7 F (36.5 C) (02/25 2025) Temp Source: Oral (02/25 2025) BP: 148/105 (02/25 2025) Pulse Rate: 77 (02/25 2025)  Labs: Recent Labs    04/20/19 1203 04/20/19 1354 04/20/19 2039 04/20/19 2310  HGB 17.6*  --  17.4*  --   HCT 51.9  --  51.5  --   PLT 253  --  269  --   APTT  --  29  --   --   LABPROT  --  12.7  --   --   INR  --  1.0  --   --   HEPARINUNFRC  --   --  <0.10* <0.10*  CREATININE 1.05  --  0.95  --   TROPONINIHS 87* 121*  --   --     Estimated Creatinine Clearance: 103 mL/min (by C-G formula based on SCr of 0.95 mg/dL).   Medications:  Infusions:  . heparin 1,100 Units/hr (04/20/19 1740)    Assessment: Patient with low heparin level.  No heparin issues per RN.  Goal of Therapy:  Heparin level 0.3-0.7 units/ml Monitor platelets by anticoagulation protocol: Yes   Plan:  Increase heparin to 1350 units/hr Recheck level at 0800  04/22/19, Rodney Black 04/21/2019,1:36 AM

## 2019-04-21 NOTE — CV Procedure (Signed)
2D echo attempted for the second time by [facility], but patient was still in Nuc Med. Will try later.

## 2019-04-21 NOTE — Progress Notes (Signed)
Made on call Np Blout aware of the pt's bp, no orders given at this time.

## 2019-04-21 NOTE — Progress Notes (Signed)
PROGRESS NOTE  Rodney Black  DOB: 07-05-1965  PCP: Patient, [name] ACZ:[id number]  DOA: 04/20/2019 Admitted From: Home  LOS: 1 day   Chief Complaint  Patient presents with  . Shortness of Breath   Brief narrative: Rodney Black is a 54 y.o. male with PMH of A. fib status post ablation, alcoholism, chronic tobacco use, bipolar disorder. Patient presented to the ED today with complaint of shortness of breath with exertion that happened 2 days ago. Patient works at [employer]. 2 days ago, while lifting a heavy bucket, patient felt acutely short of breath. He was let to go home and rest. He felt fine later and he stayed home for another 2 days. He said he talked to his roommates doctor who advised him to go to ED for evaluation. Patient smokes around one and a half pack of cigarettes a day He drinks about 3 bottles of 40 ounces of beer a day. He states he was hospitalized 5 years ago for similar symptoms and was noted to have atrial flutter. He underwent ablation. He said he also underwent cardiac cath at that time and did not have any stenosis. He took some medicine to control his heart rate for about 2 years and he self stopped. He has been taking 2 baby aspirins a day myself resident. He has not seen a physician in last few years. He does not have medical insurance.   In the ED, patient was afebrile, heart rate 103, blood pressure elevated up to 163/114, breathing comfortably on room air Labs showed unremarkable CBC and BMP, troponin was 87 and subsequently increased to 121. EKG showed sinus tachycardia at 107/min, QTC 06/13/1975, no ST-T wave changes.  Hospitalist service was called for inpatient admission and management of NSTEMI.  Patient was started on heparin drip on admission.  Subjective: Patient was seen and examined this morning.  He was waiting for stress test today.  Assessment/Plan:  NSTEMI -Presented with shortness of breath on exertion. -Has significant risk  factors for coronary disease including chronic smoking, chronic alcoholism, family history of heart disease, mother died at 70 with a massive heart attack -Troponin 1 >121>80 -Underwent nuclear stress test today which showed area of reversible ischemia in the inferior wall. -Noted a plan of cardiac cath tomorrow.  Uncontrolled hypertension -Not on any blood pressure medicine at home. -Blood pressure remains elevated 150s.  With coexisting CAD, I will start the patient on Coreg 3.25 mg from this evening.   -Continue to monitor blood pressure.  History of a flutter -Status post ablation. Currently normal sinus rhythm  Chronic alcoholism -Drinks 3 bottles of 40 ounces of alcohol. -Currently on CIWA protocol with Ativan scheduled and as needed  Chronic daily smoker -Counseled to quit. Nicotine patch offered.  DVT prophylaxis:  Lovenox subcu Antimicrobials: None Fluid: None Diet: Cardiac diet. N.p.o. after midnight for cath tomorrow.  Code Status: Full code Mobility: Encourage ambulation Family Communication: None Discharge plan:  Plan for cardiac cath tomorrow.  Discharge planning dependent on Findings.  Consultants: Cardiology  Antimicrobials: Anti-infectives (From admission, onward)   None        Code Status: Full Code   Diet Order            Diet regular Room service appropriate? Yes; Fluid consistency: Thin  Diet effective now              Infusions:    Scheduled Meds: . folic acid  1 mg Oral Daily  .  LORazepam  0-4 mg Oral Q4H   Followed by  . [START ON 04/22/2019] LORazepam  0-4 mg Oral Q8H  . multivitamin with minerals  1 tablet Oral Daily  . nicotine  21 mg Transdermal Daily  . senna  1 tablet Oral BID  . sodium chloride flush  3 mL Intravenous Q12H  . thiamine  100 mg Oral Daily   Or  . thiamine  100 mg Intravenous Daily    PRN meds: acetaminophen **OR** acetaminophen, LORazepam **OR** LORazepam, magnesium hydroxide, ondansetron  **OR** ondansetron (ZOFRAN) IV, sodium phosphate, sorbitol   Objective: Vitals:   04/21/19 1530 04/21/19 1600  BP: (!) 156/111   Pulse: (!) 102   Resp:  19  Temp: 97.9 F (36.6 C)   SpO2: 97%     Intake/Output Summary (Last 24 hours) at 04/21/2019 1807 Last data filed at 04/21/2019 1500 Gross per 24 hour  Intake 240 ml  Output --  Net 240 ml   Filed Weights   04/20/19 1618  Weight: 99.8 kg   Weight change:  Body mass index is 33.45 kg/m.   Physical Exam: General exam: Appears calm and comfortable.  Skin: No rashes, lesions or ulcers. HEENT: Atraumatic, normocephalic, supple neck, no obvious bleeding Lungs: Clear to auscultation bilaterally CVS: Regular rate and rhythm, no murmur GI/Abd soft, nontender, nondistended, bowel sound present CNS: Alert, awake, oriented x3 Psychiatry: Mood appropriate Extremities: No pedal edema, no calf tenderness  Data Review: I have personally reviewed the laboratory data and studies available.  Recent Labs  Lab 04/20/19 1203 04/20/19 2039 04/21/19 0531  WBC 10.3 9.4 7.7  NEUTROABS 7.2  --   --   HGB 17.6* 17.4* 17.8*  HCT 51.9 51.5 51.6  MCV 101.6* 101.4* 100.6*  PLT 253 269 218   Recent Labs  Lab 04/20/19 1203 04/20/19 2039 04/21/19 0531  NA 138 139 139  K 4.3 3.8 4.4  CL 107 106 107  CO2 21* 21* 23  GLUCOSE 128* 94 105*  BUN 8 9 11   CREATININE 1.05 0.95 0.97  CALCIUM 9.3 9.3 9.3  MG  --  2.0  --   PHOS  --  3.8  --     Signed, Terrilee Croak, MD [facility] Pager: [phone] (Secure Chat preferred). 04/21/2019

## 2019-04-21 NOTE — Progress Notes (Signed)
   Rodney Black presented for a nuclear stress test today.  No immediate complications.  Stress imaging is pending at this time.  Preliminary EKG findings may be listed in the chart, but the stress test result will not be finalized until perfusion imaging is complete.  Climmie Cronce David Stall, PA-C  04/21/2019, 3:14 PM

## 2019-04-21 NOTE — Progress Notes (Signed)
    Called and discussed stress test results with the patient. Informed of intermediate risk score and inferior wall ischemia. Briefly discussed the need for possible cath on Monday with these results. Cardiology will round on patient in the morning and follow up with formal plan. Will message primary team to inform as well.   Lexiscan: 04/21/19  FINDINGS: Perfusion: Decreased activity in the inferior wall on the stress images which appears normal on the rest images suggesting reversible ischemia.  Wall Motion: Mild wall motion abnormality in the inferior wall on the gated SPECT images.  Left Ventricular Ejection Fraction: 58 %  End diastolic volume 81 ml  End systolic volume 34 ml  IMPRESSION: 1. Findings consistent with inferior wall ischemia.  2. Wall motion abnormality involving the inferior wall.  3. Left ventricular ejection fraction 58%  4. Non invasive risk stratification*: Intermediate  Signed, Laverda Page, NP-C 04/21/2019, 4:53 PM Pager: [phone]

## 2019-04-21 NOTE — Consult Note (Signed)
Cardiology Consultation:   Patient ID: Rodney Black MRN: [id number]; DOB: 14-Oct-1965  Admit date: 04/20/2019 Date of Consult: 04/21/2019  Primary Care Provider: Patient, [name] Primary Cardiologist: No primary care provider on file. Dr. [name] has seen in past Primary Electrophysiologist:  None    Patient Profile:   Rodney Black is a 54 y.o. male with a hx of alcohol use, atrial flutter who is being seen today for the evaluation of elevated troponin, shortness of breath at the request of Dr. Pola Corn.  History of Present Illness:   Rodney Black is a 54 year old male with prior atrial flutter ablation by Dr. [name], alcohol use tobacco use bipolar who states that on Tuesday he felt short of breath, laid down and it resolved.  He felt fine and then approximately 2 days later his roommate said he should go to the ER for further evaluation.  Quite heavy alcohol use and smoking.  There is report that he had a heart catheterization 5 years ago but I do not see this in the records.  He only had an atrial flutter ablation apparently.  He underwent flutter ablation by Dr. [name] at that time.  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  EF resolved.  Blood pressure was noted to be 163/114 on arrival.  Initial troponin was 87 and increased to 121.  He was started on IV heparin in case this was suggestive of acute coronary syndrome.  EKG did not show any ST segment changes.  Currently he is comfortable in bed.  No chest pain, no shortness of breath.  Wondering if he can go home later today.  Discussed with him stress test.  Amenable.  Heart Pathway Score:     Past Medical History:  Diagnosis Date  . Alcohol abuse   . Atrial flutter with rapid ventricular response (HCC) 03/27/2014   unknown duration, newly diagnosed 03/2014  . Bipolar 1 disorder (HCC)   . Manic depression (HCC)   . Tobacco abuse     Past Surgical History:  Procedure  Laterality Date  . arm fracture     when patient was young  . ATRIAL FLUTTER ABLATION N/A 03/30/2014   Procedure: ATRIAL FLUTTER ABLATION;  Surgeon: [name], MD;  Location: [facility] CATH LAB;  Service: Cardiovascular;  Laterality: N/A;  . TEE WITHOUT CARDIOVERSION N/A 03/30/2014   Procedure: TRANSESOPHAGEAL ECHOCARDIOGRAM (TEE);  Surgeon: [name], MD;  Location: [facility] ENDOSCOPY;  Service: Cardiovascular;  Laterality: N/A;     Home Medications:  Prior to Admission medications   Medication Sig Start Date End Date Taking? Authorizing Provider  aspirin 325 MG tablet Take 325 mg by mouth 3 (three) times daily as needed for mild pain.    Yes [provider]  carvedilol (COREG) 3.125 MG tablet Take 1 tablet (3.125 mg total) by mouth 2 (two) times daily with a meal. Patient not taking: Reported on 04/20/2019 09/24/14   [name], MD  lisinopril (PRINIVIL,ZESTRIL) 5 MG tablet Take 1 tablet (5 mg total) by mouth daily. Patient not taking: Reported on 04/20/2019 05/29/14   Rosalio Macadamia, NP    Inpatient Medications: Scheduled Meds: . folic acid  1 mg Oral Daily  . LORazepam  0-4 mg Oral Q4H   Followed by  . [START ON 04/22/2019] LORazepam  0-4 mg Oral Q8H  . multivitamin with minerals  1 tablet Oral Daily  . nicotine  21 mg Transdermal Daily  .  senna  1 tablet Oral BID  . sodium chloride flush  3 mL Intravenous Q12H  . thiamine  100 mg Oral Daily   Or  . thiamine  100 mg Intravenous Daily   Continuous Infusions: . heparin 1,350 Units/hr (04/21/19 0157)   PRN Meds: acetaminophen **OR** acetaminophen, LORazepam **OR** LORazepam, magnesium hydroxide, ondansetron **OR** ondansetron (ZOFRAN) IV, sodium phosphate, sorbitol  Allergies:   No Known Allergies  Social History:   Social History   Socioeconomic History  . Marital status: Single    Spouse name: Not on file  . Number of children: Not on file  . Years of education: Not on file  . Highest education level: Not on  file  Occupational History  . Occupation: janitor  Tobacco Use  . Smoking status: Current Every Day Smoker  . Smokeless tobacco: Never Used  . Tobacco comment: 1.5 pack per day since high school  Substance and Sexual Activity  . Alcohol use: Yes    Alcohol/week: 0.0 standard drinks    Comment: 2x 40 oz beers daily  . Drug use: Yes    Comment: occasional marijuana use  . Sexual activity: Not on file  Other Topics Concern  . Not on file  Social History Narrative  . Not on file   Social Determinants of Health   Financial Resource Strain:   . Difficulty of Paying Living Expenses: Not on file  Food Insecurity:   . Worried About Charity fundraiser in the Last Year: Not on file  . Ran Out of Food in the Last Year: Not on file  Transportation Needs:   . Lack of Transportation (Medical): Not on file  . Lack of Transportation (Non-Medical): Not on file  Physical Activity:   . Days of Exercise per Week: Not on file  . Minutes of Exercise per Session: Not on file  Stress:   . Feeling of Stress : Not on file  Social Connections:   . Frequency of Communication with Friends and Family: Not on file  . Frequency of Social Gatherings with Friends and Family: Not on file  . Attends Religious Services: Not on file  . Active Member of Clubs or Organizations: Not on file  . Attends Archivist Meetings: Not on file  . Marital Status: Not on file  Intimate Partner Violence:   . Fear of Current or Ex-Partner: Not on file  . Emotionally Abused: Not on file  . Physically Abused: Not on file  . Sexually Abused: Not on file    Family History:    Family History  Problem Relation Age of Onset  . Hypertension Mother   . Heart attack Mother 42  . Heart disease Mother   . Lung cancer Father        heavy smoker     ROS:  Please see the history of present illness.  No fevers chills nausea vomiting syncope bleeding All other ROS reviewed and negative.     Physical Exam/Data:    Vitals:   04/20/19 1830 04/20/19 1900 04/20/19 2025 04/21/19 0545  BP: (!) 146/97 (!) 138/98 (!) 148/105 (!) 130/100  Pulse: 70 60 77 61  Resp: 14 17    Temp:   97.7 F (36.5 C) 98.4 F (36.9 C)  TempSrc:   Oral Oral  SpO2: 99% 98% 95% 97%  Weight:      Height:       No intake or output data in the 24 hours ending 04/21/19 0840 Last 3  Weights 04/20/2019 07/17/2014 05/29/2014  Weight (lbs) 220 lb 171 lb 3.2 oz 168 lb  Weight (kg) 99.791 kg 77.656 kg 76.204 kg     Body mass index is 33.45 kg/m.  General:  Well nourished, well developed, in no acute distress HEENT: normal Lymph: no adenopathy Neck: no JVD Endocrine:  No thryomegaly Vascular: No carotid bruits; FA pulses 2+ bilaterally without bruits  Cardiac:  normal S1, S2; RRR; no murmur  Lungs:  clear to auscultation bilaterally, no wheezing, rhonchi or rales  Abd: soft, nontender, no hepatomegaly  Ext: no edema Musculoskeletal:  No deformities, BUE and BLE strength normal and equal Skin: warm and dry  Neuro:  CNs 2-12 intact, no focal abnormalities noted Psych:  Normal affect   EKG:  The EKG was personally reviewed and demonstrates: Sinus tachycardia 107 with no ST segment changes Telemetry:  Telemetry was personally reviewed and demonstrates: Sinus rhythm no adverse arrhythmias  Relevant CV Studies: Echo 2016-normal EF  Laboratory Data:  High Sensitivity Troponin:   Recent Labs  Lab 04/20/19 1203 04/20/19 1354 04/21/19 0531  TROPONINIHS 87* 121* 80*     Chemistry Recent Labs  Lab 04/20/19 1203 04/20/19 2039 04/21/19 0531  NA 138 139 139  K 4.3 3.8 4.4  CL 107 106 107  CO2 21* 21* 23  GLUCOSE 128* 94 105*  BUN 8 9 11   CREATININE 1.05 0.95 0.97  CALCIUM 9.3 9.3 9.3  GFRNONAA >60 >60 >60  GFRAA >60 >60 >60  ANIONGAP 10 12 9     Recent Labs  Lab 04/20/19 2039  PROT 7.5  ALBUMIN 4.0  AST 31  ALT 61*  ALKPHOS 66  BILITOT 1.6*   Hematology Recent Labs  Lab 04/20/19 1203 04/20/19 2039  04/21/19 0531  WBC 10.3 9.4 7.7  RBC 5.11 5.08 5.13  HGB 17.6* 17.4* 17.8*  HCT 51.9 51.5 51.6  MCV 101.6* 101.4* 100.6*  MCH 34.4* 34.3* 34.7*  MCHC 33.9 33.8 34.5  RDW 12.6 12.7 12.5  PLT 253 269 218   BNPNo results for input(s): BNP, PROBNP in the last 168 hours.  DDimer  Recent Labs  Lab 04/20/19 1354  DDIMER 0.57*     Radiology/Studies:  DG Chest Portable 1 View  Result Date: 04/20/2019 CLINICAL DATA:  Shortness of breath. EXAM: PORTABLE CHEST 1 VIEW COMPARISON:  03/27/2014 FINDINGS: 11:44 a.m. No edema or pleural effusion. Streaky opacity at the bases is compatible with atelectasis or infiltrate. The cardiopericardial silhouette is within normal limits for size. The visualized bony structures of the thorax are intact. IMPRESSION: Bibasilar atelectasis or infiltrate. Electronically Signed   By: 04/22/2019 M.D.   On: 04/20/2019 12:32           Assessment and Plan:   Elevated troponin -Increased to 121 now down to 80.  This is in the setting of significant hypertension.  Not having any chest pain or shortness of breath at this time.  This could certainly be secondary to demand ischemia in the setting of hypertension.  No ischemic changes noted on ECG.  We will go ahead and discontinue the IV heparin. -I will order a nuclear stress test, Lexiscan pharmacologic which will be done at [facility] for further restratification.  Obviously if this comes back high risk, he would warrant cardiac catheterization on Monday.  He is amenable to going forward with stress test.  Alcohol use -Continue to encourage cessation, CIWA protocol  Tobacco use -Continue to encourage cessation  Prior history of atrial flutter -Post  ablation 2016 successful.  No further incidents.  This caused tachycardia which then caused EF of 25% which then resolved post ablation.  Essential hypertension -Continue to treat per primary team.  LDL 140, hyperlipidemia -Consider statin therapy.       For questions or updates, please contact [facility] Please consult [email or web address] for contact info under     Signed, Donato Schultz, MD  04/21/2019 8:40 AM

## 2019-04-21 NOTE — Consult Note (Signed)
2D echo attempted, but patient not in room. Patient in Nuc Med. Will try later.

## 2019-04-21 NOTE — Progress Notes (Addendum)
ANTICOAGULATION CONSULT NOTE - Follow Up  Pharmacy Consult for heparin Indication: ACS/NSTEMI  No Known Allergies  Patient Measurements: Height: 5\' 8"  (172.7 cm) Weight: 220 lb (99.8 kg) IBW/kg (Calculated) : 68.4 Heparin Dosing Weight: 89.8 kg  Vital Signs: Temp: 98.4 F (36.9 C) (02/26 0545) Temp Source: Oral (02/26 0545) BP: 130/100 (02/26 0545) Pulse Rate: 61 (02/26 0545)  Labs: Recent Labs    04/20/19 1203 04/20/19 1203 04/20/19 1354 04/20/19 2039 04/20/19 2310 04/21/19 0531 04/21/19 0806  HGB 17.6*   < >  --  17.4*  --  17.8*  --   HCT 51.9  --   --  51.5  --  51.6  --   PLT 253  --   --  269  --  218  --   APTT  --   --  29  --   --   --   --   LABPROT  --   --  12.7  --   --   --   --   INR  --   --  1.0  --   --   --   --   HEPARINUNFRC  --   --   --  <0.10* <0.10*  --  0.24*  CREATININE 1.05  --   --  0.95  --  0.97  --   TROPONINIHS 87*  --  121*  --   --  80*  --    < > = values in this interval not displayed.    Estimated Creatinine Clearance: 100.9 mL/min (by C-G formula based on SCr of 0.97 mg/dL).   Medical History: Past Medical History:  Diagnosis Date  . Alcohol abuse   . Atrial flutter with rapid ventricular response (HCC) 03/27/2014   unknown duration, newly diagnosed 03/2014  . Bipolar 1 disorder (HCC)   . Manic depression (HCC)   . Tobacco abuse     Assessment: 54 y.o male with PMH of Alcohol abuse, Atrial flutter with RVR presented to the ED with a chief complaint of sudden onset of shortness of breath on exertion. He reports that he takes 2 aspirin daily. Troponin elevated to 121, CBC WNL. Pharmacy has been consulted for IV heparin without bolus per MD.   Today, 04/21/19  Heparin level still SUBtherapeutic but is improving after being undetectable after increase in heparin rate from 1100 to 1350 units/hr  Hgb 17.8, stable. Plts 218 trending down some  No reported bleeding or problems/interruptions with IV site per RN  Goal of  Therapy:  Heparin level 0.3-0.7 units/ml  Monitor platelets by anticoagulation protocol: Yes   Plan:   Increase IV heparin from 1350 units/hr to 1500 units/hr  Recheck heparin level 6 hours after increase in heparin rate  Monitor daily heparin levels, CBC, s/s of bleed   04/23/19, PharmD, BCPS 04/21/2019 8:45 AM    Addendum: Note that while I was working on heparin note, Dr. 04/23/2019 d/c'd the IV heparin. Disregard above as a result  Anne Fu, PharmD, BCPS 04/21/2019 9:03 AM

## 2019-04-21 NOTE — Telephone Encounter (Signed)
Call received to office from radiology with results of Pt's stress test.  Text message sent to Dr. Anne Fu advising stress test results ready for his review.

## 2019-04-21 NOTE — Plan of Care (Signed)

## 2019-04-22 ENCOUNTER — Inpatient Hospital Stay (HOSPITAL_COMMUNITY): Payer: Self-pay

## 2019-04-22 DIAGNOSIS — R9439 Abnormal result of other cardiovascular function study: Secondary | ICD-10-CM

## 2019-04-22 DIAGNOSIS — I214 Non-ST elevation (NSTEMI) myocardial infarction: Secondary | ICD-10-CM

## 2019-04-22 DIAGNOSIS — R0602 Shortness of breath: Secondary | ICD-10-CM

## 2019-04-22 LAB — BASIC METABOLIC PANEL
Anion gap: 12 (ref 5–15)
BUN: 15 mg/dL (ref 6–20)
CO2: 20 mmol/L — ABNORMAL LOW (ref 22–32)
Calcium: 9 mg/dL (ref 8.9–10.3)
Chloride: 108 mmol/L (ref 98–111)
Creatinine, Ser: 1.06 mg/dL (ref 0.61–1.24)
GFR calc Af Amer: >60 mL/min (ref >60)
GFR calc non Af Amer: >60 mL/min (ref >60)
Glucose, Bld: 101 mg/dL — ABNORMAL HIGH (ref 70–99)
Potassium: 3.9 mmol/L (ref 3.5–5.1)
Sodium: 140 mmol/L (ref 135–145)

## 2019-04-22 LAB — CBC
HCT: 49.8 % (ref 39.0–52.0)
Hemoglobin: 17.2 g/dL — ABNORMAL HIGH (ref 13.0–17.0)
MCH: 34.8 pg — ABNORMAL HIGH (ref 26.0–34.0)
MCHC: 34.5 g/dL (ref 30.0–36.0)
MCV: 100.8 fL — ABNORMAL HIGH (ref 80.0–100.0)
Platelets: 214 10*3/uL (ref 150–400)
RBC: 4.94 MIL/uL (ref 4.22–5.81)
RDW: 12.6 % (ref 11.5–15.5)
WBC: 7.8 10*3/uL (ref 4.0–10.5)
nRBC: 0 % (ref 0.0–0.2)

## 2019-04-22 LAB — ECHOCARDIOGRAM COMPLETE
Height: 68 in
Weight: 3520 oz

## 2019-04-22 MED ORDER — CARVEDILOL 3.125 MG PO TABS: 3.1250 mg | ORAL_TABLET | Freq: Two times a day (BID) | ORAL | 0 refills | Status: DC

## 2019-04-22 MED ORDER — LISINOPRIL 10 MG PO TABS: 10.0000 mg | ORAL_TABLET | Freq: Every day | ORAL | 0 refills | Status: DC

## 2019-04-22 MED ORDER — LISINOPRIL 10 MG PO TABS
10.0000 mg | ORAL_TABLET | Freq: Every day | ORAL | Status: DC
  Administered 2019-04-22: 10 mg via ORAL
  Filled 2019-04-22: qty 1

## 2019-04-22 MED ORDER — ATORVASTATIN CALCIUM 40 MG PO TABS: 40.0000 mg | ORAL_TABLET | Freq: Every day | ORAL | 0 refills | Status: DC

## 2019-04-22 MED ORDER — ASPIRIN EC 81 MG PO TBEC: 81.0000 mg | DELAYED_RELEASE_TABLET | Freq: Every day | ORAL | 0 refills | Status: AC

## 2019-04-22 MED ORDER — ATORVASTATIN CALCIUM 40 MG PO TABS: 40.0000 mg | ORAL_TABLET | Freq: Every day | ORAL | Status: DC

## 2019-04-22 NOTE — H&P (View-Only) (Signed)
Progress Note  Patient Name: Rodney Black Date of Encounter: 04/22/2019  Primary Cardiologist:   No primary care provider on file.   Subjective   Denies chest pain or SOB and wants to go home.    Inpatient Medications    Scheduled Meds: . carvedilol  3.125 mg Oral BID WC  . enoxaparin (LOVENOX) injection  40 mg Subcutaneous Q24H  . folic acid  1 mg Oral Daily  . LORazepam  0-4 mg Oral Q4H   Followed by  . LORazepam  0-4 mg Oral Q8H  . multivitamin with minerals  1 tablet Oral Daily  . nicotine  21 mg Transdermal Daily  . senna  1 tablet Oral BID  . sodium chloride flush  3 mL Intravenous Q12H  . thiamine  100 mg Oral Daily   Or  . thiamine  100 mg Intravenous Daily   Continuous Infusions:  PRN Meds: acetaminophen **OR** acetaminophen, LORazepam **OR** LORazepam, magnesium hydroxide, ondansetron **OR** ondansetron (ZOFRAN) IV, sodium phosphate, sorbitol   Vital Signs    Vitals:   04/21/19 2139 04/21/19 2331 04/22/19 0530 04/22/19 0535  BP: (!) 157/102 (!) 142/95 (!) 142/102 (!) 140/110  Pulse: 75 72 63 66  Resp: (!) 21 20 (!) 21   Temp: 98.6 F (37 C) 98.1 F (36.7 C) 98.6 F (37 C)   TempSrc: Oral Oral Oral   SpO2: 97% 95% 97% 96%  Weight:      Height:        Intake/Output Summary (Last 24 hours) at 04/22/2019 0842 Last data filed at 04/21/2019 1500 Gross per 24 hour  Intake 240 ml  Output --  Net 240 ml   Filed Weights   04/20/19 1618  Weight: 99.8 kg    Telemetry    NSR - Personally Reviewed  ECG    NA - Personally Reviewed  Physical Exam   GEN: No acute distress.   Neck: No  JVD Cardiac: RRR, no murmurs, rubs, or gallops.  Respiratory: Clear  to auscultation bilaterally. GI: Soft, nontender, non-distended  MS: No  edema; No deformity. Neuro:  Nonfocal  Psych: Normal affect   Labs    Chemistry Recent Labs  Lab 04/20/19 2039 04/21/19 0531 04/22/19 0315  NA 139 139 140  K 3.8 4.4 3.9  CL 106 107 108  CO2 21* 23 20*   GLUCOSE 94 105* 101*  BUN 9 11 15   CREATININE 0.95 0.97 1.06  CALCIUM 9.3 9.3 9.0  PROT 7.5  --   --   ALBUMIN 4.0  --   --   AST 31  --   --   ALT 61*  --   --   ALKPHOS 66  --   --   BILITOT 1.6*  --   --   GFRNONAA >60 >60 >60  GFRAA >60 >60 >60  ANIONGAP 12 9 12      Hematology Recent Labs  Lab 04/20/19 2039 04/21/19 0531 04/22/19 0315  WBC 9.4 7.7 7.8  RBC 5.08 5.13 4.94  HGB 17.4* 17.8* 17.2*  HCT 51.5 51.6 49.8  MCV 101.4* 100.6* 100.8*  MCH 34.3* 34.7* 34.8*  MCHC 33.8 34.5 34.5  RDW 12.7 12.5 12.6  PLT 269 218 214    Cardiac EnzymesNo results for input(s): TROPONINI in the last 168 hours. No results for input(s): TROPIPOC in the last 168 hours.   BNPNo results for input(s): BNP, PROBNP in the last 168 hours.   DDimer  Recent Labs  Lab 04/20/19 1354  DDIMER 0.57*     Radiology    NM Myocar Multi W/Spect W/Wall Motion / EF  Result Date: 04/21/2019 CLINICAL DATA:  Chest pain and shortness of breath. EXAM: MYOCARDIAL IMAGING WITH SPECT (REST AND PHARMACOLOGIC-STRESS) GATED LEFT VENTRICULAR WALL MOTION STUDY LEFT VENTRICULAR EJECTION FRACTION TECHNIQUE: Standard myocardial SPECT imaging was performed after resting intravenous injection of 10 mCi Tc-99m tetrofosmin. Subsequently, intravenous infusion of Lexiscan was performed under the supervision of the Cardiology staff. At peak effect of the drug, 30 mCi Tc-99m tetrofosmin was injected intravenously and standard myocardial SPECT imaging was performed. Quantitative gated imaging was also performed to evaluate left ventricular wall motion, and estimate left ventricular ejection fraction. COMPARISON:  None. FINDINGS: Perfusion: Decreased activity in the inferior wall on the stress images which appears normal on the rest images suggesting reversible ischemia. Wall Motion: Mild wall motion abnormality in the inferior wall on the gated SPECT images. Left Ventricular Ejection Fraction: 58 % End diastolic volume 81 ml End  systolic volume 34 ml IMPRESSION: 1. Findings consistent with inferior wall ischemia. 2. Wall motion abnormality involving the inferior wall. 3. Left ventricular ejection fraction 58% 4. Non invasive risk stratification*: Intermediate *2012 Appropriate Use Criteria for Coronary Revascularization Focused Update: J Am Coll Cardiol. 2012;59(9):857-881. http://content.onlinejacc.org/article.aspx?articleid=1201161 These results will be called to the ordering clinician or representative by the Radiologist Assistant, and communication documented in the PACS or zVision Dashboard. Electronically Signed   By: P.  Gallerani M.D.   On: 04/21/2019 16:18   DG Chest Portable 1 View  Result Date: 04/20/2019 CLINICAL DATA:  Shortness of breath. EXAM: PORTABLE CHEST 1 VIEW COMPARISON:  03/27/2014 FINDINGS: 11:44 a.m. No edema or pleural effusion. Streaky opacity at the bases is compatible with atelectasis or infiltrate. The cardiopericardial silhouette is within normal limits for size. The visualized bony structures of the thorax are intact. IMPRESSION: Bibasilar atelectasis or infiltrate. Electronically Signed   By: Eric  Mansell M.D.   On: 04/20/2019 12:32    Cardiac Studies   Lexiscan Myoview.  Wall Motion: Mild wall motion abnormality in the inferior wall on the gated SPECT images.  Left Ventricular Ejection Fraction: 58 %  End diastolic volume 81 ml  End systolic volume 34 ml  IMPRESSION: 1. Findings consistent with inferior wall ischemia.  2. Wall motion abnormality involving the inferior wall.  3. Left ventricular ejection fraction 58%  4. Non invasive risk stratification*: Intermediate   Patient Profile     54 y.o. male Rodney Black is a 54 y.o. male with a hx of alcohol use, atrial flutter who is being seen today for the evaluation of elevated troponin, shortness of breath at the request of Dr. Dahal.  Assessment & Plan    Elevated troponin: Abnormal stress test as above. Echo  pending.  The perfusion study is suggestive of ischemia but not high risk.  Needs cardiac cath.  The patient understands that risks included but are not limited to stroke (1 in 1000), death (1 in 1000), kidney failure [usually temporary] (1 in 500), bleeding (1 in 200), allergic reaction [possibly serious] (1 in 200).  The patient understands and agrees to proceed.   However, he would like to go home.  We could certainly arrange cath as an outpatient.  I asked him to discuss with rounding MD and we would make plans.  Otherwise meds as below.   Alcohol use He wants a drink and a smoke.   Prior history of atrial flutter History of ablation.  Normal sinus.      Essential hypertension Add lisinopril.   LDL 140, hyperlipidemia Will start statin.  NL liver enzymes.  Use with caution with ETOH use.    For questions or updates, please contact [facility] Please consult [email or web address] for contact info under Cardiology/STEMI.   Signed, Rollene Rotunda, MD  04/22/2019, 8:42 AM

## 2019-04-22 NOTE — Discharge Summary (Signed)
Physician Discharge Summary  Rodney Black QMV:[id number] DOB: [birth year] DOA: 04/20/2019  PCP: Patient, [name]  Admit date: 04/20/2019 Discharge date: 04/22/2019  Admitted From: Home Discharge disposition: Home   Code Status: Full Code  Diet Recommendation: Cardiac diet  Recommendations for Outpatient Follow-Up:   1. Follow-up with PCP as an outpatient 2. Follow-up with cardiology as an outpatient.  Needs cardiac cath as an outpatient  Discharge Diagnosis:   Active Problems:   ACS (acute coronary syndrome) ([place])  History of Present Illness / Brief narrative:  Rodney Black a 54 y.o.malewith PMH of A. fib status post ablation, alcoholism, chronic tobacco use, bipolar disorder. Patient presented to the ED today with complaint of shortness of breath with exertion that happened 2 days ago. Patient works at [employer]. 2 days ago, while lifting a heavy bucket, patient felt acutely short of breath. He was let to go home and rest. He felt fine later and he stayed home for another 2 days. He said he talked to his roommates doctor who advised him to go to ED for evaluation. Patient smokes aroundone and a halfpack of cigarettes a day He drinks about 3 bottles of 40 ounces of beer a day. He states he was hospitalized 5 years ago for similar symptoms and was noted to have atrial flutter. He underwent ablation. He said he also underwent cardiac cath at that time and did not have any stenosis. He took some medicine to control his heart rate for about 2 years and he self stopped. He has not seen a physician in last few years. He does not have medical insurance.   In the ED, patient was afebrile, heart rate 103, blood pressure elevated up to 163/114, breathing comfortably on room air Labs showed unremarkable CBC and BMP, troponin was 87 and subsequently increased to 121. EKG showed sinus tachycardia at 107/min, QTC 06/13/1975, no ST-T wave changes.  Hospitalist service was called  for inpatient admission and management of NSTEMI.  Patient was started on heparin drip on admission.  Hospital Course:  NSTEMI -Presented with shortness of breath on exertion. -Has significant risk factors for coronary disease including chronic smoking, chronic alcoholism, family history of heart disease, mother died at 86 with a massive heart attack -Troponin 49 >121>80 -Underwent nuclear stress test 2/26 which showed area of reversible ischemia in the inferior wall. -Cardiology plans to do a cardiac catheterization on Monday.  However, patient wants to be discharged home.  He states he has to take care of his doctor who has multiple sclerosis. -Cardiology plans to see him as an outpatient and is scheduled for outpatient cardiac cath. -Continue aspirin 80 mg daily and Lipitor 40 mg daily at home.   Uncontrolled hypertension -Patient was supposed to be on Coreg and lisinopril at home which he has not been compliant to.  Resumed both.  Blood pressure better controlled on medicines.  Continue same at home.  Scripts given. -Continue to monitor blood pressure at home.  History of a flutter -Status post ablation. Currently normal sinus rhythm  Chronic alcoholism -Drinks 3 bottles of 40 ounces of alcohol. -Kept on CIWA protocol in the hospital.  No withdrawal symptoms. -Counseled to quit alcohol.  Chronic daily smoker -Counseled to quit. Nicotine patch offered.  Okay to discharge to home today.  Subjective:  Seen and examined this morning.  Not in distress.  No chest pain, shortness of, palpitation, wants to go home.  Discharge Exam:   Vitals:   04/21/19  2139 04/21/19 2331 04/22/19 0530 04/22/19 0535  BP: (!) 157/102 (!) 142/95 (!) 142/102 (!) 140/110  Pulse: 75 72 63 66  Resp: (!) 21 20 (!) 21   Temp: 98.6 F (37 C) 98.1 F (36.7 C) 98.6 F (37 C)   TempSrc: Oral Oral Oral   SpO2: 97% 95% 97% 96%  Weight:      Height:        Body mass index is 33.45 kg/m.  General  exam: Appears calm and comfortable.  Skin: No rashes, lesions or ulcers. HEENT: Atraumatic, normocephalic, supple neck, no obvious bleeding Lungs: Clear to auscultation bilaterally CVS: Regular rate and rhythm, no murmur GI/Abd soft, nontender, nondistended, bowel sounds present CNS: Alert, awake, oriented x3 Psychiatry: Mood appropriate Extremities: No pedal edema, no calf tenderness  Discharge Instructions:  Wound care: None Discharge Instructions    Ambulatory referral to Cardiology   Complete by: As directed    Diet - low sodium heart healthy   Complete by: As directed    Increase activity slowly   Complete by: As directed      Follow-up Information    [facility] Follow up.   Contact information: [address] [place] [address] [phone]       Jake Bathe, MD Follow up.   Specialty: Cardiology Contact information: [address] [place] [address] [phone]          Allergies as of 04/22/2019   No Known Allergies     Medication List    STOP taking these medications   aspirin 325 MG tablet Replaced by: aspirin EC 81 MG tablet     TAKE these medications   aspirin EC 81 MG tablet Take 1 tablet (81 mg total) by mouth daily. Replaces: aspirin 325 MG tablet   atorvastatin 40 MG tablet Commonly known as: LIPITOR Take 1 tablet (40 mg total) by mouth daily at 6 PM.   carvedilol 3.125 MG tablet Commonly known as: COREG Take 1 tablet (3.125 mg total) by mouth 2 (two) times daily with a meal.   lisinopril 10 MG tablet Commonly known as: ZESTRIL Take 1 tablet (10 mg total) by mouth daily. Start taking on: April 23, 2019 What changed:   medication strength  how much to take       Time coordinating discharge: 35 minutes  The results of significant diagnostics from this hospitalization (including imaging, microbiology, ancillary and laboratory) are listed below for  reference.    Procedures and Diagnostic Studies:   NM Myocar Multi W/Spect W/Wall Motion / EF  Result Date: 04/21/2019 CLINICAL DATA:  Chest pain and shortness of breath. EXAM: MYOCARDIAL IMAGING WITH SPECT (REST AND PHARMACOLOGIC-STRESS) GATED LEFT VENTRICULAR WALL MOTION STUDY LEFT VENTRICULAR EJECTION FRACTION TECHNIQUE: Standard myocardial SPECT imaging was performed after resting intravenous injection of 10 mCi Tc-40m tetrofosmin. Subsequently, intravenous infusion of Lexiscan was performed under the supervision of the Cardiology staff. At peak effect of the drug, 30 mCi Tc-16m tetrofosmin was injected intravenously and standard myocardial SPECT imaging was performed. Quantitative gated imaging was also performed to evaluate left ventricular wall motion, and estimate left ventricular ejection fraction. COMPARISON:  None. FINDINGS: Perfusion: Decreased activity in the inferior wall on the stress images which appears normal on the rest images suggesting reversible ischemia. Wall Motion: Mild wall motion abnormality in the inferior wall on the gated SPECT images. Left Ventricular Ejection Fraction: 58 % End diastolic volume 81 ml End systolic  volume 34 ml IMPRESSION: 1. Findings consistent with inferior wall ischemia. 2. Wall motion abnormality involving the inferior wall. 3. Left ventricular ejection fraction 58% 4. Non invasive risk stratification*: Intermediate *2012 Appropriate Use Criteria for Coronary Revascularization Focused Update: J Am Coll Cardiol. 2012;59(9):857-881. http://content.dementiazones.com.aspx?articleid=1201161 These results will be called to the ordering clinician or representative by the Radiologist Assistant, and communication documented in the PACS or zVision Dashboard. Electronically Signed   By: Rudie Meyer M.D.   On: 04/21/2019 16:18   ECHOCARDIOGRAM COMPLETE  Result Date: 04/22/2019    ECHOCARDIOGRAM REPORT   Patient Name:   Rodney Black Date of Exam: 04/22/2019  Medical Rec #:  [id number]        Height:       68.0 in Accession #:    [id number]       Weight:       220.0 lb Date of Birth:  1965-04-08        BSA:          2.128 m Patient Age:    53 years         BP:           140/110 mmHg Patient Gender: M                HR:           62 bpm. Exam Location:  Inpatient Procedure: 2D Echo Indications:    NSTEMI  History:        Patient has prior history of Echocardiogram examinations, most                 recent 07/10/2014. Tobacco abuse. ETOH Abuse. HX of A-Flutter.  Sonographer:    Celene Skeen RDCS (AE) Referring Phys: [id number] [facility]  Sonographer Comments: Image acquisition challenging due to patient body habitus. IMPRESSIONS  1. Left ventricular ejection fraction, by estimation, is 60 to 65%. The left ventricle has normal function. The left ventricle has no regional wall motion abnormalities. Left ventricular diastolic parameters are consistent with Grade I diastolic dysfunction (impaired relaxation).  2. Right ventricular systolic function is normal. The right ventricular size is normal. Tricuspid regurgitation signal is inadequate for assessing PA pressure.  3. The mitral valve is normal in structure and function. No evidence of mitral valve regurgitation. No evidence of mitral stenosis.  4. The aortic valve was not well visualized. Aortic valve regurgitation is not visualized. Mild to moderate aortic valve sclerosis/calcification is present, without any evidence of aortic stenosis.  5. The inferior vena cava is normal in size with greater than 50% respiratory variability, suggesting right atrial pressure of 3 mmHg. FINDINGS  Left Ventricle: Left ventricular ejection fraction, by estimation, is 60 to 65%. The left ventricle has normal function. The left ventricle has no regional wall motion abnormalities. The left ventricular internal cavity size was normal in size. There is  no left ventricular hypertrophy. Left ventricular diastolic parameters are consistent with  Grade I diastolic dysfunction (impaired relaxation). Normal left ventricular filling pressure. Right Ventricle: The right ventricular size is normal. No increase in right ventricular wall thickness. Right ventricular systolic function is normal. Tricuspid regurgitation signal is inadequate for assessing PA pressure. Left Atrium: Left atrial size was normal in size. Right Atrium: Right atrial size was normal in size. Pericardium: There is no evidence of pericardial effusion. Mitral Valve: The mitral valve is normal in structure and function. Normal mobility of the mitral valve leaflets. No evidence of mitral valve regurgitation. No evidence  of mitral valve stenosis. Tricuspid Valve: The tricuspid valve is normal in structure. Tricuspid valve regurgitation is not demonstrated. No evidence of tricuspid stenosis. Aortic Valve: The aortic valve was not well visualized. Aortic valve regurgitation is not visualized. Mild to moderate aortic valve sclerosis/calcification is present, without any evidence of aortic stenosis. Pulmonic Valve: The pulmonic valve was normal in structure. Pulmonic valve regurgitation is not visualized. No evidence of pulmonic stenosis. Aorta: The aortic root is normal in size and structure. Venous: The inferior vena cava was not well visualized. The inferior vena cava is normal in size with greater than 50% respiratory variability, suggesting right atrial pressure of 3 mmHg. IAS/Shunts: No atrial level shunt detected by color flow Doppler.  LEFT VENTRICLE PLAX 2D LVIDd:         5.40 cm  Diastology LVIDs:         3.50 cm  LV e' lateral:   9.79 cm/s LV PW:         0.90 cm  LV E/e' lateral: 5.0 LV IVS:        1.00 cm  LV e' medial:    5.66 cm/s LVOT diam:     2.10 cm  LV E/e' medial:  8.7 LV SV:         60 LV SV Index:   28 LVOT Area:     3.46 cm  RIGHT VENTRICLE TAPSE (M-mode): 2.3 cm LEFT ATRIUM             Index       RIGHT ATRIUM           Index LA diam:        3.00 cm 1.41 cm/m  RA Area:      12.50 cm LA Vol (A2C):   72.3 ml 33.97 ml/m RA Volume:   25.70 ml  12.08 ml/m LA Vol (A4C):   23.6 ml 11.09 ml/m LA Biplane Vol: 43.9 ml 20.63 ml/m  AORTIC VALVE LVOT Vmax:   90.70 cm/s LVOT Vmean:  65.500 cm/s LVOT VTI:    0.174 m  AORTA Ao Root diam: 3.40 cm MITRAL VALVE MV Area (PHT): 2.11 cm    SHUNTS MV Decel Time: 359 msec    Systemic VTI:  0.17 m MV E velocity: 49.30 cm/s  Systemic Diam: 2.10 cm MV A velocity: 73.70 cm/s MV E/A ratio:  0.67 Armanda Magic MD Electronically signed by Armanda Magic MD Signature Date/Time: 04/22/2019/12:01:40 PM    Final      Labs:   Basic Metabolic Panel: Recent Labs  Lab 04/20/19 1203 04/20/19 1203 04/20/19 2039 04/20/19 2039 04/21/19 0531 04/22/19 0315  NA 138  --  139  --  139 140  K 4.3   < > 3.8   < > 4.4 3.9  CL 107  --  106  --  107 108  CO2 21*  --  21*  --  23 20*  GLUCOSE 128*  --  94  --  105* 101*  BUN 8  --  9  --  11 15  CREATININE 1.05  --  0.95  --  0.97 1.06  CALCIUM 9.3  --  9.3  --  9.3 9.0  MG  --   --  2.0  --   --   --   PHOS  --   --  3.8  --   --   --    < > = values in this interval not displayed.   GFR Estimated Creatinine  Clearance: 92.3 mL/min (by C-G formula based on SCr of 1.06 mg/dL). Liver Function Tests: Recent Labs  Lab 04/20/19 2039  AST 31  ALT 61*  ALKPHOS 66  BILITOT 1.6*  PROT 7.5  ALBUMIN 4.0   No results for input(s): LIPASE, AMYLASE in the last 168 hours. No results for input(s): AMMONIA in the last 168 hours. Coagulation profile Recent Labs  Lab 04/20/19 1354  INR 1.0    CBC: Recent Labs  Lab 04/20/19 1203 04/20/19 2039 04/21/19 0531 04/22/19 0315  WBC 10.3 9.4 7.7 7.8  NEUTROABS 7.2  --   --   --   HGB 17.6* 17.4* 17.8* 17.2*  HCT 51.9 51.5 51.6 49.8  MCV 101.6* 101.4* 100.6* 100.8*  PLT 253 269 218 214   Cardiac Enzymes: No results for input(s): CKTOTAL, CKMB, CKMBINDEX, TROPONINI in the last 168 hours. BNP: Invalid input(s): POCBNP CBG: No results for input(s):  GLUCAP in the last 168 hours. D-Dimer Recent Labs    04/20/19 1354  DDIMER 0.57*   Hgb A1c Recent Labs    04/20/19 2039  HGBA1C 5.9*   Lipid Profile Recent Labs    04/21/19 0806  CHOL 206*  HDL 40*  LDLCALC 140*  TRIG 132  CHOLHDL 5.2   Thyroid function studies No results for input(s): TSH, T4TOTAL, T3FREE, THYROIDAB in the last 72 hours.  Invalid input(s): FREET3 Anemia work up No results for input(s): VITAMINB12, FOLATE, FERRITIN, TIBC, IRON, RETICCTPCT in the last 72 hours. Microbiology Recent Results (from the past 240 hour(s))  SARS CORONAVIRUS 2 (TAT 6-24 HRS) Nasopharyngeal Nasopharyngeal Swab     Status: None   Collection Time: 04/20/19  4:45 PM   Specimen: Nasopharyngeal Swab  Result Value Ref Range Status   SARS Coronavirus 2 NEGATIVE NEGATIVE Final    Comment: (NOTE) SARS-CoV-2 target nucleic acids are NOT DETECTED. The SARS-CoV-2 RNA is generally detectable in upper and lower respiratory specimens during the acute phase of infection. Negative results do not preclude SARS-CoV-2 infection, do not rule out co-infections with other pathogens, and should not be used as the sole basis for treatment or other patient management decisions. Negative results must be combined with clinical observations, patient history, and epidemiological information. The expected result is Negative. Fact Sheet for Patients: HairSlick.no Fact Sheet for Healthcare Providers: quierodirigir.com This test is not yet approved or cleared by the [place] FDA and  has been authorized for detection and/or diagnosis of SARS-CoV-2 by FDA under an Emergency Use Authorization (EUA). This EUA will remain  in effect (meaning this test can be used) for the duration of the COVID-19 declaration under Section 56 4(b)(1) of the Act, 21 U.S.C. section 360bbb-3(b)(1), unless the authorization is terminated or revoked sooner. Performed at  [facility], [address] [place], [place] [address]     Please note: You were cared for by a hospitalist during your hospital stay. Once you are discharged, your primary care physician will handle any further medical issues. Please note that NO REFILLS for any discharge medications will be authorized once you are discharged, as it is imperative that you return to your primary care physician (or establish a relationship with a primary care physician if you do not have one) for your post hospital discharge needs so that they can reassess your need for medications and monitor your lab values.  Signed: Lorin Glass  [facility] 04/22/2019, 12:30 PM

## 2019-04-22 NOTE — Progress Notes (Signed)
Progress Note  Patient Name: Rodney Black Date of Encounter: 04/22/2019  Primary Cardiologist:   No primary care provider on file.   Subjective   Denies chest pain or SOB and wants to go home.    Inpatient Medications    Scheduled Meds: . carvedilol  3.125 mg Oral BID WC  . enoxaparin (LOVENOX) injection  40 mg Subcutaneous Q24H  . folic acid  1 mg Oral Daily  . LORazepam  0-4 mg Oral Q4H   Followed by  . LORazepam  0-4 mg Oral Q8H  . multivitamin with minerals  1 tablet Oral Daily  . nicotine  21 mg Transdermal Daily  . senna  1 tablet Oral BID  . sodium chloride flush  3 mL Intravenous Q12H  . thiamine  100 mg Oral Daily   Or  . thiamine  100 mg Intravenous Daily   Continuous Infusions:  PRN Meds: acetaminophen **OR** acetaminophen, LORazepam **OR** LORazepam, magnesium hydroxide, ondansetron **OR** ondansetron (ZOFRAN) IV, sodium phosphate, sorbitol   Vital Signs    Vitals:   04/21/19 2139 04/21/19 2331 04/22/19 0530 04/22/19 0535  BP: (!) 157/102 (!) 142/95 (!) 142/102 (!) 140/110  Pulse: 75 72 63 66  Resp: (!) 21 20 (!) 21   Temp: 98.6 F (37 C) 98.1 F (36.7 C) 98.6 F (37 C)   TempSrc: Oral Oral Oral   SpO2: 97% 95% 97% 96%  Weight:      Height:        Intake/Output Summary (Last 24 hours) at 04/22/2019 0842 Last data filed at 04/21/2019 1500 Gross per 24 hour  Intake 240 ml  Output --  Net 240 ml   Filed Weights   04/20/19 1618  Weight: 99.8 kg    Telemetry    NSR - Personally Reviewed  ECG    NA - Personally Reviewed  Physical Exam   GEN: No acute distress.   Neck: No  JVD Cardiac: RRR, no murmurs, rubs, or gallops.  Respiratory: Clear  to auscultation bilaterally. GI: Soft, nontender, non-distended  MS: No  edema; No deformity. Neuro:  Nonfocal  Psych: Normal affect   Labs    Chemistry Recent Labs  Lab 04/20/19 2039 04/21/19 0531 04/22/19 0315  NA 139 139 140  K 3.8 4.4 3.9  CL 106 107 108  CO2 21* 23 20*   GLUCOSE 94 105* 101*  BUN 9 11 15   CREATININE 0.95 0.97 1.06  CALCIUM 9.3 9.3 9.0  PROT 7.5  --   --   ALBUMIN 4.0  --   --   AST 31  --   --   ALT 61*  --   --   ALKPHOS 66  --   --   BILITOT 1.6*  --   --   GFRNONAA >60 >60 >60  GFRAA >60 >60 >60  ANIONGAP 12 9 12      Hematology Recent Labs  Lab 04/20/19 2039 04/21/19 0531 04/22/19 0315  WBC 9.4 7.7 7.8  RBC 5.08 5.13 4.94  HGB 17.4* 17.8* 17.2*  HCT 51.5 51.6 49.8  MCV 101.4* 100.6* 100.8*  MCH 34.3* 34.7* 34.8*  MCHC 33.8 34.5 34.5  RDW 12.7 12.5 12.6  PLT 269 218 214    Cardiac EnzymesNo results for input(s): TROPONINI in the last 168 hours. No results for input(s): TROPIPOC in the last 168 hours.   BNPNo results for input(s): BNP, PROBNP in the last 168 hours.   DDimer  Recent Labs  Lab 04/20/19 1354  DDIMER 0.57*     Radiology    NM Myocar Multi W/Spect W/Wall Motion / EF  Result Date: 04/21/2019 CLINICAL DATA:  Chest pain and shortness of breath. EXAM: MYOCARDIAL IMAGING WITH SPECT (REST AND PHARMACOLOGIC-STRESS) GATED LEFT VENTRICULAR WALL MOTION STUDY LEFT VENTRICULAR EJECTION FRACTION TECHNIQUE: Standard myocardial SPECT imaging was performed after resting intravenous injection of 10 mCi Tc-56m tetrofosmin. Subsequently, intravenous infusion of Lexiscan was performed under the supervision of the Cardiology staff. At peak effect of the drug, 30 mCi Tc-43m tetrofosmin was injected intravenously and standard myocardial SPECT imaging was performed. Quantitative gated imaging was also performed to evaluate left ventricular wall motion, and estimate left ventricular ejection fraction. COMPARISON:  None. FINDINGS: Perfusion: Decreased activity in the inferior wall on the stress images which appears normal on the rest images suggesting reversible ischemia. Wall Motion: Mild wall motion abnormality in the inferior wall on the gated SPECT images. Left Ventricular Ejection Fraction: 58 % End diastolic volume 81 ml End  systolic volume 34 ml IMPRESSION: 1. Findings consistent with inferior wall ischemia. 2. Wall motion abnormality involving the inferior wall. 3. Left ventricular ejection fraction 58% 4. Non invasive risk stratification*: Intermediate *2012 Appropriate Use Criteria for Coronary Revascularization Focused Update: J Am Coll Cardiol. 2012;59(9):857-881. http://content.dementiazones.com.aspx?articleid=1201161 These results will be called to the ordering clinician or representative by the Radiologist Assistant, and communication documented in the PACS or zVision Dashboard. Electronically Signed   By: Rudie Meyer M.D.   On: 04/21/2019 16:18   DG Chest Portable 1 View  Result Date: 04/20/2019 CLINICAL DATA:  Shortness of breath. EXAM: PORTABLE CHEST 1 VIEW COMPARISON:  03/27/2014 FINDINGS: 11:44 a.m. No edema or pleural effusion. Streaky opacity at the bases is compatible with atelectasis or infiltrate. The cardiopericardial silhouette is within normal limits for size. The visualized bony structures of the thorax are intact. IMPRESSION: Bibasilar atelectasis or infiltrate. Electronically Signed   By: Kennith Center M.D.   On: 04/20/2019 12:32    Cardiac Studies   Lexiscan Myoview.  Wall Motion: Mild wall motion abnormality in the inferior wall on the gated SPECT images.  Left Ventricular Ejection Fraction: 58 %  End diastolic volume 81 ml  End systolic volume 34 ml  IMPRESSION: 1. Findings consistent with inferior wall ischemia.  2. Wall motion abnormality involving the inferior wall.  3. Left ventricular ejection fraction 58%  4. Non invasive risk stratification*: Intermediate   Patient Profile     54 y.o. male Rodney Black is a 54 y.o. male with a hx of alcohol use, atrial flutter who is being seen today for the evaluation of elevated troponin, shortness of breath at the request of Dr. Pola Corn.  Assessment & Plan    Elevated troponin: Abnormal stress test as above. Echo  pending.  The perfusion study is suggestive of ischemia but not high risk.  Needs cardiac cath.  The patient understands that risks included but are not limited to stroke (1 in 1000), death (1 in 1000), kidney failure [usually temporary] (1 in 500), bleeding (1 in 200), allergic reaction [possibly serious] (1 in 200).  The patient understands and agrees to proceed.   However, he would like to go home.  We could certainly arrange cath as an outpatient.  I asked him to discuss with rounding MD and we would make plans.  Otherwise meds as below.   Alcohol use He wants a drink and a smoke.   Prior history of atrial flutter History of ablation.  Normal sinus.  Essential hypertension Add lisinopril.   LDL 140, hyperlipidemia Will start statin.  NL liver enzymes.  Use with caution with ETOH use.    For questions or updates, please contact [facility] Please consult [email or web address] for contact info under Cardiology/STEMI.   Signed, Rollene Rotunda, MD  04/22/2019, 8:42 AM

## 2019-04-22 NOTE — TOC Progression Note (Signed)
Transition of Care [facility]) - Progression Note    Patient Details  Name: Rodney Black MRN: [id number] Date of Birth: November 19, 1965  [facility]) CM/SW Contact  Armanda Heritage, RN Phone Number: 04/22/2019, 1:04 PM  Clinical Narrative:    [facility] consult noted for pcp.  CM unable to schedule follow-up with [facility] due to weekend and clinic is closed. Clinic information placed on AVS for patient to contact.     Expected Discharge Plan: Home/Self Care Barriers to Discharge: No Barriers Identified  Expected Discharge Plan and Services Expected Discharge Plan: Home/Self Care   Discharge Planning Services: CM Consult   Living arrangements for the past 2 months: Single Family Home Expected Discharge Date: 04/22/19               DME Arranged: N/A DME Agency: NA       HH Arranged: NA HH Agency: NA         Social Determinants of Health (SDOH) Interventions    Readmission Risk Interventions No flowsheet data found.

## 2019-04-24 ENCOUNTER — Encounter: Payer: Self-pay | Admitting: *Deleted

## 2019-04-24 ENCOUNTER — Telehealth: Payer: Self-pay | Admitting: Cardiology

## 2019-04-24 MED FILL — LISINOPRIL 10 MG TABS: 10 | 30 days supply | Qty: 30 | Fill #0

## 2019-04-24 MED FILL — ATORVASTATIN CALCIUM 40 MG: 40 | 30 days supply | Qty: 30 | Fill #0

## 2019-04-24 MED FILL — CARVEDILOL 3.125 MG TABLET: 3.125 | 30 days supply | Qty: 60 | Fill #0

## 2019-04-24 NOTE — Telephone Encounter (Signed)
   Follow up  Pt returning call to schedule his stent procedure and Cath. He said that he's is ok any day this week except tomorrow.  Please advise

## 2019-04-24 NOTE — Telephone Encounter (Signed)
New Message     Pt is calling and says he is needing to schedule a stent procedure and a Cath    Please call

## 2019-04-24 NOTE — Telephone Encounter (Signed)
Per pt has been quarantined since discharge so does not require another COVID test left detailed message of cath instructions on voice mail .Zack Seal

## 2019-04-24 NOTE — Telephone Encounter (Signed)
Lm to call back to make arrangements for cath ./cy

## 2019-04-24 NOTE — Telephone Encounter (Signed)
Your physician has requested that you have a cardiac catheterization. Cardiac catheterization is used to diagnose and/or treat various heart conditions. Doctors may recommend this procedure for a number of different reasons. The most common reason is to evaluate chest pain. Chest pain can be a symptom of coronary artery disease (CAD), and cardiac catheterization can show whether plaque is narrowing or blocking your heart's arteries. This procedure is also used to evaluate the valves, as well as measure the blood flow and oxygen levels in different parts of your heart. For further information please visit [email or web address] Please follow instruction sheet, as given.  pt is scheduled for 04/26/19 wsith Dr Tresa Endo be there at 8:00 am .[facility] CARDIOVASCULAR DIVISION [facility] ST OFFICE [address] [place] [address] Dept: [phone] Loc: [phone]  DOWELL HOON  04/24/2019  You are scheduled for a Cardiac Catheterization on Wednesday, March 3 with Dr. Nicki Guadalajara.  1. Please arrive at the [facility] (Main Entrance A) at [facility]: [address] [place], [place] [address] at 8:00 AM (This time is two hours before your procedure to ensure your preparation). Free valet parking service is available.   Special note: Every effort is made to have your procedure done on time. Please understand that emergencies sometimes delay scheduled procedures.  2. Diet: Do not eat solid foods after midnight.  The patient may have clear liquids until 5am upon the day of the procedure.  3. Labs: You will need to have blood drawn on labs from hospital stay are okay per [facility]. Medication instructions in preparation for your procedure:   Contrast Allergy: No    Current Outpatient Medications (Cardiovascular):  .  atorvastatin (LIPITOR) 40 MG tablet, Take 1 tablet (40 mg total) by mouth daily at 6 PM. .  carvedilol (COREG)  3.125 MG tablet, Take 1 tablet (3.125 mg total) by mouth 2 (two) times daily with a meal. .  lisinopril (ZESTRIL) 10 MG tablet, Take 1 tablet (10 mg total) by mouth daily.   Current Outpatient Medications (Analgesics):  .  aspirin EC 81 MG tablet, Take 1 tablet (81 mg total) by mouth daily.  On the morning of your procedure, take your Aspirin and any morning medicines NOT listed above.  You may use sips of water.  5. Plan for one night stay--bring personal belongings. 6. Bring a current list of your medications and current insurance cards. 7. You MUST have a responsible person to drive you home. 8. Someone MUST be with you the first 24 hours after you arrive home or your discharge will be delayed. 9. Please wear clothes that are easy to get on and off and wear slip-on shoes.  Thank you for allowing [place] to care for you!   -- [facility]

## 2019-04-25 ENCOUNTER — Telehealth: Payer: Self-pay | Admitting: *Deleted

## 2019-04-25 NOTE — Telephone Encounter (Addendum)
Pt contacted pre-catheterization scheduled at [facility] for: Wednesday April 26, 2019 10 AM Verified arrival time and place: [facility] Main Entrance A [facility]) at: 8 AM   No solid food after midnight prior to cath, clear liquids until 5 AM day of procedure. Contrast allergy: no  AM meds can be  taken pre-cath with sip of water including: ASA 81 mg   Confirmed patient has responsible adult to drive home post procedure and observe 24 hours after arriving home: yes  Currently, due to Covid-19 pandemic, only one person will be allowed with patient. Must be the same person for patient's entire stay and will be required to wear a mask. They will be asked to wait in the waiting room for the duration of the patient's stay.  Patients are required to wear a mask when they enter the hospital.      COVID-19 Pre-Screening Questions:  . In the past 7 to 10 days have you had a cough,  shortness of breath, headache, congestion, fever (100 or greater) body aches, chills, sore throat, or sudden loss of taste or sense of smell? no . Have you been around anyone with known Covid 19 in the past 7-10 days? no . Have you been around anyone who is awaiting Covid 19 test results in the past 7 to 10 days? no . Have you been around anyone who has been exposed to Covid 19, or has mentioned symptoms of Covid 19 within the past 7 to 10 days? no  I reviewed procedure/mask/visitor instructions, COVID-19 screening questions with patient, he verbalized understanding, thanked me for call. Pt states he has been in quarantine since discharge from hospital 04/22/19.

## 2019-04-26 ENCOUNTER — Other Ambulatory Visit: Payer: Self-pay

## 2019-04-26 ENCOUNTER — Ambulatory Visit (HOSPITAL_COMMUNITY)
Admission: RE | Admit: 2019-04-26 | Discharge: 2019-04-26 | Disposition: A | Discharge status: Home / Self Care | Payer: Self-pay | Source: Ambulatory Visit | Attending: Cardiovascular Disease | Admitting: Cardiovascular Disease

## 2019-04-26 ENCOUNTER — Encounter (HOSPITAL_COMMUNITY)
Admission: RE | Disposition: A | Discharge status: Home / Self Care | Payer: Self-pay | Source: Ambulatory Visit | Attending: Cardiovascular Disease

## 2019-04-26 DIAGNOSIS — I2584 Coronary atherosclerosis due to calcified coronary lesion: Secondary | ICD-10-CM | POA: Insufficient documentation

## 2019-04-26 DIAGNOSIS — I10 Essential (primary) hypertension: Secondary | ICD-10-CM | POA: Insufficient documentation

## 2019-04-26 DIAGNOSIS — Z8679 Personal history of other diseases of the circulatory system: Secondary | ICD-10-CM | POA: Insufficient documentation

## 2019-04-26 DIAGNOSIS — E785 Hyperlipidemia, unspecified: Secondary | ICD-10-CM | POA: Insufficient documentation

## 2019-04-26 DIAGNOSIS — I2510 Atherosclerotic heart disease of native coronary artery without angina pectoris: Secondary | ICD-10-CM | POA: Insufficient documentation

## 2019-04-26 DIAGNOSIS — R9439 Abnormal result of other cardiovascular function study: Secondary | ICD-10-CM | POA: Insufficient documentation

## 2019-04-26 DIAGNOSIS — R0602 Shortness of breath: Secondary | ICD-10-CM | POA: Insufficient documentation

## 2019-04-26 HISTORY — PX: LEFT HEART CATH AND CORONARY ANGIOGRAPHY: CATH118249

## 2019-04-26 SURGERY — LEFT HEART CATH AND CORONARY ANGIOGRAPHY
Anesthesia: LOCAL

## 2019-04-26 MED ORDER — FENTANYL CITRATE (PF) 100 MCG/2ML IJ SOLN
INTRAMUSCULAR | Status: DC | PRN
  Administered 2019-04-26: 50 ug via INTRAVENOUS

## 2019-04-26 MED ORDER — ASPIRIN 81 MG PO CHEW: 81.0000 mg | CHEWABLE_TABLET | ORAL | Status: DC

## 2019-04-26 MED ORDER — VERAPAMIL HCL 2.5 MG/ML IV SOLN
INTRAVENOUS | Status: AC
  Filled 2019-04-26: qty 2

## 2019-04-26 MED ORDER — SODIUM CHLORIDE 0.9% FLUSH: 3.0000 mL | Freq: Two times a day (BID) | INTRAVENOUS | Status: DC

## 2019-04-26 MED ORDER — SODIUM CHLORIDE 0.9 % WEIGHT BASED INFUSION: 3.0000 mL/kg/h | INTRAVENOUS | Status: AC

## 2019-04-26 MED ORDER — VERAPAMIL HCL 2.5 MG/ML IV SOLN
INTRAVENOUS | Status: DC | PRN
  Administered 2019-04-26: 10 mL via INTRA_ARTERIAL

## 2019-04-26 MED ORDER — MIDAZOLAM HCL 2 MG/2ML IJ SOLN
INTRAMUSCULAR | Status: DC | PRN
  Administered 2019-04-26: 2 mg via INTRAVENOUS
  Administered 2019-04-26: 1 mg via INTRAVENOUS

## 2019-04-26 MED ORDER — IOHEXOL 350 MG/ML SOLN
INTRAVENOUS | Status: AC
  Filled 2019-04-26: qty 1

## 2019-04-26 MED ORDER — SODIUM CHLORIDE 0.9 % IV SOLN: 250.0000 mL | INTRAVENOUS | Status: DC | PRN

## 2019-04-26 MED ORDER — IOHEXOL 350 MG/ML SOLN
INTRAVENOUS | Status: DC | PRN
  Administered 2019-04-26: 45 mL

## 2019-04-26 MED ORDER — HEPARIN SODIUM (PORCINE) 1000 UNIT/ML IJ SOLN
INTRAMUSCULAR | Status: AC
  Filled 2019-04-26: qty 1

## 2019-04-26 MED ORDER — MIDAZOLAM HCL 2 MG/2ML IJ SOLN
INTRAMUSCULAR | Status: AC
  Filled 2019-04-26: qty 2

## 2019-04-26 MED ORDER — LIDOCAINE HCL (PF) 1 % IJ SOLN
INTRAMUSCULAR | Status: DC | PRN
  Administered 2019-04-26: 2 mL

## 2019-04-26 MED ORDER — HEPARIN (PORCINE) IN NACL 1000-0.9 UT/500ML-% IV SOLN
INTRAVENOUS | Status: DC | PRN
  Administered 2019-04-26 (×2): 500 mL

## 2019-04-26 MED ORDER — HEPARIN SODIUM (PORCINE) 1000 UNIT/ML IJ SOLN
INTRAMUSCULAR | Status: DC | PRN
  Administered 2019-04-26: 5000 [IU] via INTRAVENOUS

## 2019-04-26 MED ORDER — LIDOCAINE HCL (PF) 1 % IJ SOLN
INTRAMUSCULAR | Status: AC
  Filled 2019-04-26: qty 30

## 2019-04-26 MED ORDER — SODIUM CHLORIDE 0.9 % WEIGHT BASED INFUSION: 1.0000 mL/kg/h | INTRAVENOUS | Status: DC

## 2019-04-26 MED ORDER — HEPARIN (PORCINE) IN NACL 1000-0.9 UT/500ML-% IV SOLN
INTRAVENOUS | Status: AC
  Filled 2019-04-26: qty 1000

## 2019-04-26 MED ORDER — FENTANYL CITRATE (PF) 100 MCG/2ML IJ SOLN
INTRAMUSCULAR | Status: AC
  Filled 2019-04-26: qty 2

## 2019-04-26 MED ORDER — SODIUM CHLORIDE 0.9% FLUSH: 3.0000 mL | INTRAVENOUS | Status: DC | PRN

## 2019-04-26 SURGICAL SUPPLY — 9 items
CATH OPTITORQUE TIG 4.0 5F (CATHETERS) ×1 IMPLANT
DEVICE RAD COMP TR BAND LRG (VASCULAR PRODUCTS) ×1 IMPLANT
GLIDESHEATH SLEND SS 6F .021 (SHEATH) ×1 IMPLANT
GUIDEWIRE INQWIRE 1.5J.035X260 (WIRE) IMPLANT
INQWIRE 1.5J .035X260CM (WIRE) ×2
KIT HEART LEFT (KITS) ×2 IMPLANT
PACK CARDIAC CATHETERIZATION (CUSTOM PROCEDURE TRAY) ×2 IMPLANT
TRANSDUCER W/STOPCOCK (MISCELLANEOUS) ×2 IMPLANT
TUBING CIL FLEX 10 FLL-RA (TUBING) ×2 IMPLANT

## 2019-04-26 NOTE — Interval H&P Note (Signed)
Cath Lab Visit (complete for each Cath Lab visit)  Clinical Evaluation Leading to the Procedure:   ACS: No.  Non-ACS:    Anginal Classification: CCS III  Anti-ischemic medical therapy: Minimal Therapy (1 class of medications)  Non-Invasive Test Results: Intermediate-risk stress test findings: cardiac mortality 1-3%/year  Prior CABG: No previous CABG      History and Physical Interval Note:  04/26/2019 9:48 AM  Rodney Black  has presented today for surgery, with the diagnosis of abn myoview.  The various methods of treatment have been discussed with the patient and family. After consideration of risks, benefits and other options for treatment, the patient has consented to  Procedure(s): LEFT HEART CATH AND CORONARY ANGIOGRAPHY (N/A) as a surgical intervention.  The patient's history has been reviewed, patient examined, no change in status, stable for surgery.  I have reviewed the patient's chart and labs.  Questions were answered to the patient's satisfaction.     Rodney Black

## 2019-04-26 NOTE — Progress Notes (Signed)
Discharge instructions reviewed with patient and family. Verbalized understanding. 

## 2019-04-26 NOTE — Discharge Instructions (Signed)
Radial Site Care  This sheet gives you information about how to care for yourself after your procedure. Your health care provider may also give you more specific instructions. If you have problems or questions, contact your health care provider. What can I expect after the procedure? After the procedure, it is common to have:  Bruising and tenderness at the catheter insertion area. Follow these instructions at home: Medicines  Take over-the-counter and prescription medicines only as told by your health care provider. Insertion site care  Follow instructions from your health care provider about how to take care of your insertion site. Make sure you: ? Wash your hands with soap and water before you change your bandage (dressing). If soap and water are not available, use hand sanitizer. ? Change your dressing as told by your health care provider. ? Leave stitches (sutures), skin glue, or adhesive strips in place. These skin closures may need to stay in place for 2 weeks or longer. If adhesive strip edges start to loosen and curl up, you may trim the loose edges. Do not remove adhesive strips completely unless your health care provider tells you to do that.  Check your insertion site every day for signs of infection. Check for: ? Redness, swelling, or pain. ? Fluid or blood. ? Pus or a bad smell. ? Warmth.  Do not take baths, swim, or use a hot tub until your health care provider approves.  You may shower 24-48 hours after the procedure, or as directed by your health care provider. ? Remove the dressing and gently wash the site with plain soap and water. ? Pat the area dry with a clean towel. ? Do not rub the site. That could cause bleeding.  Do not apply powder or lotion to the site. Activity   For 24 hours after the procedure, or as directed by your health care provider: ? Do not flex or bend the affected arm. ? Do not push or pull heavy objects with the affected arm. ? Do not  drive yourself home from the hospital or clinic. You may drive 24 hours after the procedure unless your health care provider tells you not to. ? Do not operate machinery or power tools.  Do not lift anything that is heavier than 10 lb (4.5 kg), or the limit that you are told, until your health care provider says that it is safe.  Ask your health care provider when it is okay to: ? Return to work or school. ? Resume usual physical activities or sports. ? Resume sexual activity. General instructions  If the catheter site starts to bleed, raise your arm and put firm pressure on the site. If the bleeding does not stop, get help right away. This is a medical emergency.  If you went home on the same day as your procedure, a responsible adult should be with you for the first 24 hours after you arrive home.  Keep all follow-up visits as told by your health care provider. This is important. Contact a health care provider if:  You have a fever.  You have redness, swelling, or yellow drainage around your insertion site. Get help right away if:  You have unusual pain at the radial site.  The catheter insertion area swells very fast.  The insertion area is bleeding, and the bleeding does not stop when you hold steady pressure on the area.  Your arm or hand becomes pale, cool, tingly, or numb. These symptoms may represent a serious problem   that is an emergency. Do not wait to see if the symptoms will go away. Get medical help right away. Call your local emergency services (911 in the [place] Do not drive yourself to the hospital. Summary  After the procedure, it is common to have bruising and tenderness at the site.  Follow instructions from your health care provider about how to take care of your radial site wound. Check the wound every day for signs of infection.  Do not lift anything that is heavier than 10 lb (4.5 kg), or the limit that you are told, until your health care provider says  that it is safe. This information is not intended to replace advice given to you by your health care provider. Make sure you discuss any questions you have with your health care provider. Document Revised: 03/17/2017 Document Reviewed: 03/17/2017 Elsevier Patient Education  2020 Elsevier Inc.  

## 2019-05-11 ENCOUNTER — Other Ambulatory Visit: Payer: Self-pay

## 2019-05-11 ENCOUNTER — Encounter: Payer: Self-pay | Admitting: Cardiology

## 2019-05-11 ENCOUNTER — Ambulatory Visit (INDEPENDENT_AMBULATORY_CARE_PROVIDER_SITE_OTHER): Payer: Self-pay | Admitting: Cardiology

## 2019-05-11 VITALS — BP 144/90 | HR 72 | Ht 68.0 in | Wt 215.0 lb

## 2019-05-11 DIAGNOSIS — Z789 Other specified health status: Secondary | ICD-10-CM

## 2019-05-11 DIAGNOSIS — F1090 Alcohol use, unspecified, uncomplicated: Secondary | ICD-10-CM

## 2019-05-11 DIAGNOSIS — I214 Non-ST elevation (NSTEMI) myocardial infarction: Secondary | ICD-10-CM

## 2019-05-11 DIAGNOSIS — Z7289 Other problems related to lifestyle: Secondary | ICD-10-CM

## 2019-05-11 DIAGNOSIS — Z720 Tobacco use: Secondary | ICD-10-CM

## 2019-05-11 DIAGNOSIS — I10 Essential (primary) hypertension: Secondary | ICD-10-CM

## 2019-05-11 HISTORY — DX: Alcohol use, unspecified, uncomplicated: F10.90

## 2019-05-11 HISTORY — DX: Other specified health status: Z78.9

## 2019-05-11 HISTORY — DX: Other problems related to lifestyle: Z72.89

## 2019-05-11 HISTORY — DX: Non-ST elevation (NSTEMI) myocardial infarction: I21.4

## 2019-05-11 MED ORDER — CARVEDILOL 6.25 MG PO TABS: 6.2500 mg | ORAL_TABLET | Freq: Two times a day (BID) | ORAL | 3 refills | Status: DC

## 2019-05-11 MED ORDER — LISINOPRIL 20 MG PO TABS: 20.0000 mg | ORAL_TABLET | Freq: Every day | ORAL | 3 refills | Status: DC

## 2019-05-11 MED FILL — CARVEDILOL 6.25 MG TABLET: 6.25 | 30 days supply | Qty: 60 | Fill #0

## 2019-05-11 NOTE — Patient Instructions (Signed)
Medication Instructions:  Please increase Carvedilol to 6.25 mg twice a day. Increase Lisinopril to 20 mg a day. Continue all other medications as listed.  *If you need a refill on your cardiac medications before your next appointment, please call your pharmacy*  Follow-Up: At [facility], you and your health needs are our priority.  As part of our continuing mission to provide you with exceptional heart care, we have created designated Provider Care Teams.  These Care Teams include your primary Cardiologist (physician) and Advanced Practice Providers (APPs -  Physician Assistants and Nurse Practitioners) who all work together to provide you with the care you need, when you need it.  We recommend signing up for the patient portal called "MyChart".  Sign up information is provided on this After Visit Summary.  MyChart is used to connect with patients for Virtual Visits (Telemedicine).  Patients are able to view lab/test results, encounter notes, upcoming appointments, etc.  Non-urgent messages can be sent to your provider as well.   To learn more about what you can do with MyChart, go to [email or web address].    Your next appointment:   4 week(s)  The format for your next appointment:   In Person  Provider:   You may see Donato Schultz, MD or one of the following Advanced Practice Providers on your designated Care Team:    Norma Fredrickson, NP  Nada Boozer, NP  Georgie Chard, NP    Thank you for choosing [facility]!!

## 2019-05-11 NOTE — Progress Notes (Signed)
Cardiology Office Note:    Date:  05/11/2019   ID:  Rodney Black, DOB 1966/01/21, MRN [id number]  PCP:  Patient, [name]  Cardiologist:  Donato Schultz, MD  Electrophysiologist:  None   Referring MD: Rodney Glass, MD     History of Present Illness:    Rodney Black is a 54 y.o. male admitted in February 2021 with elevated troponin non-ST elevation myocardial infarction with nuclear stress test showing possible inferior wall ischemia.  Alcohol use.  Prior history of atrial flutter.   Mid LAD lesion is 35% stenosed.   The left main branches into the LAD, two ramus intermediate branches and the left circumflex coronary artery.  Mild nonobstructive coronary artery disease with an area of 35% stenosis with mild calcification in the LAD with normal ramus intermediate vessels, left circumflex, and RCA.  LVEDP 5 mmHg.  RECOMMENDATION: Medical therapy.  Smoking cessation is essential.  Aggressive lipid-lowering therapy with target LDL less than 70.     Past Medical History:  Diagnosis Date  . Alcohol abuse   . Atrial flutter with rapid ventricular response (HCC) 03/27/2014   unknown duration, newly diagnosed 03/2014  . Bipolar 1 disorder (HCC)   . Manic depression (HCC)   . Tobacco abuse     Past Surgical History:  Procedure Laterality Date  . arm fracture     when patient was young  . ATRIAL FLUTTER ABLATION N/A 03/30/2014   Procedure: ATRIAL FLUTTER ABLATION;  Surgeon: [name], MD;  Location: [facility] CATH LAB;  Service: Cardiovascular;  Laterality: N/A;  . LEFT HEART CATH AND CORONARY ANGIOGRAPHY N/A 04/26/2019   Procedure: LEFT HEART CATH AND CORONARY ANGIOGRAPHY;  Surgeon: Lennette Bihari, MD;  Location: [facility] INVASIVE CV LAB;  Service: Cardiovascular;  Laterality: N/A;  . TEE WITHOUT CARDIOVERSION N/A 03/30/2014   Procedure: TRANSESOPHAGEAL ECHOCARDIOGRAM (TEE);  Surgeon: [name], MD;  Location: [facility] ENDOSCOPY;  Service: Cardiovascular;  Laterality: N/A;     Current Medications: Current Meds  Medication Sig  . aspirin EC 81 MG tablet Take 1 tablet (81 mg total) by mouth daily.  Marland Kitchen atorvastatin (LIPITOR) 40 MG tablet Take 1 tablet (40 mg total) by mouth daily at 6 PM.  . [DISCONTINUED] carvedilol (COREG) 3.125 MG tablet Take 1 tablet (3.125 mg total) by mouth 2 (two) times daily with a meal.  . [DISCONTINUED] lisinopril (ZESTRIL) 10 MG tablet Take 1 tablet (10 mg total) by mouth daily.     Allergies:   Patient has no known allergies.   Social History   Socioeconomic History  . Marital status: Single    Spouse name: Not on file  . Number of children: Not on file  . Years of education: Not on file  . Highest education level: Not on file  Occupational History  . Occupation: janitor  Tobacco Use  . Smoking status: Current Every Day Smoker  . Smokeless tobacco: Never Used  . Tobacco comment: 1.5 pack per day since high school  Substance and Sexual Activity  . Alcohol use: Yes    Alcohol/week: 0.0 standard drinks    Comment: 2x 40 oz beers daily  . Drug use: Yes    Comment: occasional marijuana use  . Sexual activity: Not on file  Other Topics Concern  . Not on file  Social History Narrative  . Not on file   Social Determinants of Health   Financial Resource Strain:   . Difficulty of Paying Living Expenses:   Food  Insecurity:   . Worried About Charity fundraiser in the Last Year:   . Arboriculturist in the Last Year:   Transportation Needs:   . Film/video editor (Medical):   Marland Kitchen Lack of Transportation (Non-Medical):   Physical Activity:   . Days of Exercise per Week:   . Minutes of Exercise per Session:   Stress:   . Feeling of Stress :   Social Connections:   . Frequency of Communication with Friends and Family:   . Frequency of Social Gatherings with Friends and Family:   . Attends Religious Services:   . Active Member of Clubs or Organizations:   . Attends Archivist Meetings:   Marland Kitchen Marital Status:       Family History: The patient's family history includes Heart attack (age of onset: 81) in his mother; Heart disease in his mother; Hypertension in his mother; Lung cancer in his father.  ROS:   Please see the history of present illness.     All other systems reviewed and are negative.  EKGs/Labs/Other Studies Reviewed:    The following studies were reviewed today: Cardiac catheterization hospitalization records reviewed  EKG:  EKG is not ordered today.   Recent Labs: 04/20/2019: ALT 61; Magnesium 2.0 04/22/2019: BUN 15; Creatinine, Ser 1.06; Hemoglobin 17.2; Platelets 214; Potassium 3.9; Sodium 140  Recent Lipid Panel    Component Value Date/Time   CHOL 206 (H) 04/21/2019 0806   TRIG 132 04/21/2019 0806   HDL 40 (L) 04/21/2019 0806   CHOLHDL 5.2 04/21/2019 0806   VLDL 26 04/21/2019 0806   LDLCALC 140 (H) 04/21/2019 0806    Physical Exam:    VS:  BP (!) 144/90   Pulse 72   Ht 5\' 8"  (1.727 m)   Wt 215 lb (97.5 kg)   SpO2 96%   BMI 32.69 kg/m     Wt Readings from Last 3 Encounters:  05/11/19 215 lb (97.5 kg)  04/26/19 220 lb (99.8 kg)  04/20/19 220 lb (99.8 kg)     GEN:  Well nourished, well developed in no acute distress HEENT: Normal NECK: No JVD; No carotid bruits LYMPHATICS: No lymphadenopathy CARDIAC: RRR, no murmurs, rubs, gallops RESPIRATORY:  Clear to auscultation without rales, wheezing or rhonchi  ABDOMEN: Soft, non-tender, non-distended MUSCULOSKELETAL:  No edema; No deformity  SKIN: Warm and dry NEUROLOGIC:  Alert and oriented x 3 PSYCHIATRIC:  Normal affect   ASSESSMENT:    1. Non-ST elevation (NSTEMI) myocardial infarction ([place])   2. Tobacco use   3. Essential hypertension   4. Alcohol use    PLAN:    In order of problems listed above:  Non-ST elevation myocardial infarction Essential hypertension Tobacco use Alcohol use -Nonobstructive mid LAD disease.  False positive nuclear stress test.  Continue with aggressive risk factor  modification including aspirin statin carvedilol lisinopril.  Blood pressure still needs optimization.  We will go ahead and increase his carvedilol to 6.25 mg twice a day and increase his lisinopril to 20 mg once a day. -We discussed tactics to quit smoking.  Alcohol reduction.  Challenging of course for him.  Worried about weight gain.    Medication Adjustments/Labs and Tests Ordered: Current medicines are reviewed at length with the patient today.  Concerns regarding medicines are outlined above.  No orders of the defined types were placed in this encounter.  Meds ordered this encounter  Medications  . carvedilol (COREG) 6.25 MG tablet    Sig: Take  1 tablet (6.25 mg total) by mouth 2 (two) times daily.    Dispense:  180 tablet    Refill:  3  . lisinopril (ZESTRIL) 20 MG tablet    Sig: Take 1 tablet (20 mg total) by mouth daily.    Dispense:  90 tablet    Refill:  3    Patient Instructions  Medication Instructions:  Please increase Carvedilol to 6.25 mg twice a day. Increase Lisinopril to 20 mg a day. Continue all other medications as listed.  *If you need a refill on your cardiac medications before your next appointment, please call your pharmacy*  Follow-Up: At [facility], you and your health needs are our priority.  As part of our continuing mission to provide you with exceptional heart care, we have created designated Provider Care Teams.  These Care Teams include your primary Cardiologist (physician) and Advanced Practice Providers (APPs -  Physician Assistants and Nurse Practitioners) who all work together to provide you with the care you need, when you need it.  We recommend signing up for the patient portal called "MyChart".  Sign up information is provided on this After Visit Summary.  MyChart is used to connect with patients for Virtual Visits (Telemedicine).  Patients are able to view lab/test results, encounter notes, upcoming appointments, etc.  Non-urgent messages  can be sent to your provider as well.   To learn more about what you can do with MyChart, go to [email or web address].    Your next appointment:   4 week(s)  The format for your next appointment:   In Person  Provider:   You may see Donato Schultz, MD or one of the following Advanced Practice Providers on your designated Care Team:    Norma Fredrickson, NP  Nada Boozer, NP  Georgie Chard, NP    Thank you for choosing [facility]!!        Signed, Donato Schultz, MD  05/11/2019 9:21 AM    [facility]

## 2019-05-24 ENCOUNTER — Telehealth: Payer: Self-pay | Admitting: Nurse Practitioner

## 2019-05-24 NOTE — Telephone Encounter (Signed)
Norma Fredrickson, NP has not seen pt since 2016.  Last ov note is Dr. Anne Fu. Given back to med rec.

## 2019-05-24 NOTE — Telephone Encounter (Signed)
FMLA/disability form received from [employer]. Placed in box for Norma Fredrickson, NP to review. 05/24/19 vlm

## 2019-05-26 ENCOUNTER — Telehealth: Payer: Self-pay | Admitting: Cardiology

## 2019-05-26 NOTE — Telephone Encounter (Signed)
Patient's last visit was with Dr. Anne Fu. Revised FMLA form received and placed in box for Dr. Anne Fu to review. 05/26/19 vlm

## 2019-06-01 ENCOUNTER — Other Ambulatory Visit: Payer: Self-pay | Admitting: Cardiology

## 2019-06-01 ENCOUNTER — Telehealth: Payer: Self-pay | Admitting: Cardiology

## 2019-06-01 MED ORDER — ATORVASTATIN CALCIUM 40 MG PO TABS: 40.0000 mg | ORAL_TABLET | Freq: Every day | ORAL | 3 refills | Status: DC

## 2019-06-01 MED FILL — ATORVASTATIN CALCIUM 40 MG: 40 | 30 days supply | Qty: 30 | Fill #0

## 2019-06-01 NOTE — Telephone Encounter (Signed)
*  STAT* If patient is at the pharmacy, call can be transferred to refill team.   1. Which medications need to be refilled? (please list name of each medication and dose if known)  carvedilol (COREG) 6.25 MG tablet atorvastatin (LIPITOR) 40 MG tablet  2. Which pharmacy/location (including street and city if local pharmacy) is medication to be sent to? [facility] [place], [place] - [place] [address]   3. Do they need a 30 day or 90 day supply? 30 day  Patient is out of both medications.

## 2019-06-01 NOTE — Telephone Encounter (Signed)
Called pt and left message informing pt that his medication carvedilol was already at his pharmacy and that I needed to sent Dr. Anne Fu a message asking if he would like to refill Atorvastatin, because Dr. Anne Fu did not prescribe this medication and it is not a heart medication. Would Dr. Anne Fu like to refill atorvastatin for the pt? Please address

## 2019-06-01 NOTE — Telephone Encounter (Signed)
Pt was just seen by Dr Anne Fu 3/18.  He had recent lipid panel during his hospitalization 04/21/2019 and atorvastatin was started at that time by the hospitalitis.  Refill sent into pharmacy as requested.

## 2019-06-02 ENCOUNTER — Telehealth: Payer: Self-pay | Admitting: Cardiology

## 2019-06-02 NOTE — Telephone Encounter (Signed)
Harriett Sine from [place] of Absence department on behalf of the patient's employer. She states they would like clarification of when he can return to work. She states their phones are not working so the information can be faxed to: [phone].

## 2019-06-02 NOTE — Telephone Encounter (Signed)
FMLA forms reprinted from media.  Dr Anne Fu reviewed and documented patient may return to work today 06/02/2019.  Paperwork taken to MR and faxed to # as requested.

## 2019-06-06 NOTE — Progress Notes (Signed)
CARDIOLOGY OFFICE NOTE  Date:  06/14/2019    Rodney Black Date of Birth: 31-Jul-1965 Medical Record #[id number]  PCP:  Patient, [name]  Cardiologist:  Anne Fu   Chief Complaint  Patient presents with  . Follow-up    Seen for Dr. Anne Fu    History of Present Illness: Rodney Black is a 54 y.o. male who presents today for a one month check. Seen for Dr. Anne Fu.   He has a story of prior atrial flutter with prior ablation, ongoing alcohol and tobacco abuse, bipolar disorder and was admitted in February 2021 with elevated troponin non-ST elevation myocardial infarction with nuclear stress test showing possible inferior wall ischemia. Cath showed mid LAD 35% stenosed - to be managed medically. Cholesterol medicine started. He had not previously seen a physician for a few years and has no insurance.   Last seen a month ago by Dr. Anne Fu - felt to be doing ok. BP still up - Coreg and ACE were increased.   The patient does not have symptoms concerning for COVID-19 infection (fever, chills, cough, or new shortness of breath).   Comes in today. Here alone. Says he is doing well. He is not on one medicine - sounds like his cholesterol medicine. He has not returned to work. Waiting on paperwork from [employer]. Ok to return to his prior position from our standpoint. He does not think he is going to be able to stop smoking - he has gained a few pounds already. Still drinking beer " 3 40's". Eats only once a day. He is wondering about getting "The Blue Book" - to help stop drinking - he does not think that AA is currently holding his meetings. Asking about "antabuse".   Past Medical History:  Diagnosis Date  . Alcohol abuse   . Atrial flutter with rapid ventricular response (HCC) 03/27/2014   unknown duration, newly diagnosed 03/2014  . Bipolar 1 disorder (HCC)   . Manic depression (HCC)   . Tobacco abuse     Past Surgical History:  Procedure Laterality Date  . arm fracture     when patient was young  . ATRIAL FLUTTER ABLATION N/A 03/30/2014   Procedure: ATRIAL FLUTTER ABLATION;  Surgeon: [name], MD;  Location: [facility] CATH LAB;  Service: Cardiovascular;  Laterality: N/A;  . LEFT HEART CATH AND CORONARY ANGIOGRAPHY N/A 04/26/2019   Procedure: LEFT HEART CATH AND CORONARY ANGIOGRAPHY;  Surgeon: Lennette Bihari, MD;  Location: [facility] INVASIVE CV LAB;  Service: Cardiovascular;  Laterality: N/A;  . TEE WITHOUT CARDIOVERSION N/A 03/30/2014   Procedure: TRANSESOPHAGEAL ECHOCARDIOGRAM (TEE);  Surgeon: [name], MD;  Location: [facility] ENDOSCOPY;  Service: Cardiovascular;  Laterality: N/A;     Medications: Current Meds  Medication Sig  . aspirin EC 81 MG tablet Take 1 tablet (81 mg total) by mouth daily.  Marland Kitchen atorvastatin (LIPITOR) 40 MG tablet Take 1 tablet (40 mg total) by mouth daily at 6 PM.  . carvedilol (COREG) 6.25 MG tablet Take 1 tablet (6.25 mg total) by mouth 2 (two) times daily.  Marland Kitchen lisinopril (ZESTRIL) 20 MG tablet Take 1 tablet (20 mg total) by mouth daily.     Allergies: No Known Allergies  Social History: The patient  reports that he has been smoking. He has never used smokeless tobacco. He reports current alcohol use. He reports current drug use.   Family History: The patient's family history includes Heart attack (age of onset: 79) in his  mother; Heart disease in his mother; Hypertension in his mother; Lung cancer in his father.   Review of Systems: Please see the history of present illness.   All other systems are reviewed and negative.   Physical Exam: VS:  BP (!) 146/94   Pulse 78   Ht 5\' 8"  (1.727 m)   Wt 219 lb (99.3 kg)   SpO2 96%   BMI 33.30 kg/m  .  BMI Body mass index is 33.3 kg/m.  Wt Readings from Last 3 Encounters:  06/14/19 219 lb (99.3 kg)  05/11/19 215 lb (97.5 kg)  04/26/19 220 lb (99.8 kg)   BP is 180/110.  General: Looks much older than his stated age. Smells heavily of tobacco. He is in no acute distress.   Cardiac:  Regular rate and rhythm. No murmurs, rubs, or gallops. No edema.  Respiratory:  Lungs are coarse with normal work of breathing.  MS: No deformity or atrophy. Gait and ROM intact.  Skin: Warm and dry. Color is normal.  Neuro:  Strength and sensation are intact and no gross focal deficits noted.  Psych: Alert, appropriate and with normal affect.   LABORATORY DATA:  EKG:  EKG is not ordered today.  Lab Results  Component Value Date   WBC 7.8 04/22/2019   HGB 17.2 (H) 04/22/2019   HCT 49.8 04/22/2019   PLT 214 04/22/2019   GLUCOSE 101 (H) 04/22/2019   CHOL 206 (H) 04/21/2019   TRIG 132 04/21/2019   HDL 40 (L) 04/21/2019   LDLCALC 140 (H) 04/21/2019   ALT 61 (H) 04/20/2019   AST 31 04/20/2019   NA 140 04/22/2019   K 3.9 04/22/2019   CL 108 04/22/2019   CREATININE 1.06 04/22/2019   BUN 15 04/22/2019   CO2 20 (L) 04/22/2019   TSH 1.178 03/27/2014   INR 1.0 04/20/2019   HGBA1C 5.9 (H) 04/20/2019     BNP (last 3 results) No results for input(s): BNP in the last 8760 hours.  ProBNP (last 3 results) No results for input(s): PROBNP in the last 8760 hours.   Other Studies Reviewed Today:  LEFT HEART CATH AND CORONARY ANGIOGRAPHY 04/26/2019  Conclusion    Mid LAD lesion is 35% stenosed.   The left main branches into the LAD, two ramus intermediate branches and the left circumflex coronary artery.  Mild nonobstructive coronary artery disease with an area of 35% stenosis with mild calcification in the LAD with normal ramus intermediate vessels, left circumflex, and RCA.  LVEDP 5 mmHg.  RECOMMENDATION: Medical therapy.  Smoking cessation is essential.  Aggressive lipid-lowering therapy with target LDL less than 70.    ECHO IMPRESSIONS 03/2019  1. Left ventricular ejection fraction, by estimation, is 60 to 65%. The  left ventricle has normal function. The left ventricle has no regional  wall motion abnormalities. Left ventricular diastolic parameters are  consistent  with Grade I diastolic  dysfunction (impaired relaxation).  2. Right ventricular systolic function is normal. The right ventricular  size is normal. Tricuspid regurgitation signal is inadequate for assessing  PA pressure.  3. The mitral valve is normal in structure and function. No evidence of  mitral valve regurgitation. No evidence of mitral stenosis.  4. The aortic valve was not well visualized. Aortic valve regurgitation  is not visualized. Mild to moderate aortic valve sclerosis/calcification  is present, without any evidence of aortic stenosis.  5. The inferior vena cava is normal in size with greater than 50%  respiratory variability, suggesting right  atrial pressure of 3 mmHg.     ASSESSMENT & PLAN:    1. Prior NSTEMI - noted to have non obstructive mid LAD disease at time of cath - false + nuclear test - needs aggressive CV risk factor modification. Coreg increased at last visit along with his ACE.  He did not realize the Lipitor had been refilled - will send in again today. Very sad situation with overall situation pretty tenuous to me.   2. Tobacco abuse - he does not think he is going to be able to stop.   3. HTN - uncontrolled. Adding Norvasc 5 mg today - I think he is taking his Coreg and Lisinopril. BMET today.   4. Alcohol use - he is asking for "The Blue Book" - if any AA meetings are actually meeting. Will reach out to Social Work and see if there are any options.   5. Elevated LFTs - he was not aware - explained that this could be from alcohol - recheck today.  6. Bipolar/manic/depressive - he is going to see about getting PCP and then maybe back to psyche - he was apparently on Lithium in the past. Lots of issues with this - his twin has died, does not sound like he coped well with mother's death, etc.   7. COVID-19 Education: The signs and symptoms of COVID-19 were discussed with the patient and how to seek care for testing (follow up with PCP or arrange  E-visit).  The importance of social distancing, staying at home, hand hygiene and wearing a mask when out in public were discussed today.  Current medicines are reviewed with the patient today.  The patient does not have concerns regarding medicines other than what has been noted above.  The following changes have been made:  See above.  Labs/ tests ordered today include:    Orders Placed This Encounter  Procedures  . Basic metabolic panel  . Hepatic function panel     Disposition:   FU with me in 2 weeks. Overall situation tenuous.    Patient is agreeable to this plan and will call if any problems develop in the interim.   SignedNorma Fredrickson, NP  06/14/2019 10:12 AM  [facility] [address] [place], [place]  [address] Phone: [phone] Fax: [phone]

## 2019-06-14 ENCOUNTER — Encounter: Payer: Self-pay | Admitting: Nurse Practitioner

## 2019-06-14 ENCOUNTER — Telehealth: Payer: Self-pay | Admitting: Licensed Clinical Social Worker

## 2019-06-14 ENCOUNTER — Ambulatory Visit (INDEPENDENT_AMBULATORY_CARE_PROVIDER_SITE_OTHER): Payer: Self-pay | Admitting: Nurse Practitioner

## 2019-06-14 ENCOUNTER — Other Ambulatory Visit: Payer: Self-pay

## 2019-06-14 VITALS — BP 146/94 | HR 78 | Ht 68.0 in | Wt 219.0 lb

## 2019-06-14 DIAGNOSIS — E782 Mixed hyperlipidemia: Secondary | ICD-10-CM

## 2019-06-14 DIAGNOSIS — Z720 Tobacco use: Secondary | ICD-10-CM

## 2019-06-14 DIAGNOSIS — I10 Essential (primary) hypertension: Secondary | ICD-10-CM

## 2019-06-14 DIAGNOSIS — I214 Non-ST elevation (NSTEMI) myocardial infarction: Secondary | ICD-10-CM

## 2019-06-14 LAB — BASIC METABOLIC PANEL
BUN/Creatinine Ratio: 12 (ref 9–20)
BUN: 12 mg/dL (ref 6–24)
CO2: 22 mmol/L (ref 20–29)
Calcium: 9.9 mg/dL (ref 8.7–10.2)
Chloride: 105 mmol/L (ref 96–106)
Creatinine, Ser: 1.04 mg/dL (ref 0.76–1.27)
GFR calc Af Amer: 94 mL/min/{1.73_m2} (ref >59)
GFR calc non Af Amer: 82 mL/min/{1.73_m2} (ref >59)
Glucose: 113 mg/dL — ABNORMAL HIGH (ref 65–99)
Potassium: 4.6 mmol/L (ref 3.5–5.2)
Sodium: 143 mmol/L (ref 134–144)

## 2019-06-14 LAB — HEPATIC FUNCTION PANEL
ALT: 73 IU/L — ABNORMAL HIGH (ref 0–44)
AST: 41 IU/L — ABNORMAL HIGH (ref 0–40)
Albumin: 4.5 g/dL (ref 3.8–4.9)
Alkaline Phosphatase: 94 IU/L (ref 39–117)
Bilirubin Total: 0.6 mg/dL (ref 0.0–1.2)
Bilirubin, Direct: 0.18 mg/dL (ref 0.00–0.40)
Total Protein: 7.1 g/dL (ref 6.0–8.5)

## 2019-06-14 MED ORDER — ATORVASTATIN CALCIUM 40 MG PO TABS: 40.0000 mg | ORAL_TABLET | Freq: Every day | ORAL | 3 refills | Status: DC

## 2019-06-14 MED ORDER — ASPIRIN EC 81 MG PO TBEC: 81.0000 mg | DELAYED_RELEASE_TABLET | Freq: Every day | ORAL | 3 refills | Status: DC

## 2019-06-14 MED ORDER — AMLODIPINE BESYLATE 5 MG PO TABS: 5.0000 mg | ORAL_TABLET | Freq: Every day | ORAL | 3 refills | Status: DC

## 2019-06-14 MED FILL — AMLODIPINE BESYLATE 5 MG TA: 5 | 30 days supply | Qty: 30 | Fill #0

## 2019-06-14 NOTE — Patient Instructions (Addendum)
After Visit Summary:  We will be checking the following labs today - BMET & HPF   Medication Instructions:    Continue with your current medicines. BUT  I am adding Norvasc 5 mg a day - go pick this up today  I sent another refill for your cholesterol medicine also   If you need a refill on your cardiac medications before your next appointment, please call your pharmacy.     Testing/Procedures To Be Arranged:  N/A  Follow-Up:   See me in about 2 weeks or so    At [facility], you and your health needs are our priority.  As part of our continuing mission to provide you with exceptional heart care, we have created designated Provider Care Teams.  These Care Teams include your primary Cardiologist (physician) and Advanced Practice Providers (APPs -  Physician Assistants and Nurse Practitioners) who all work together to provide you with the care you need, when you need it.  Special Instructions:  . Stay safe, stay home, wash your hands for at least 20 seconds and wear a mask when out in public.  . It was good to talk with you today.  . Talk to your roommate about getting a primary care doctor . I will send a message to the Social Worker about an AA book/meetings   Call the [facility] office at [phone] if you have any questions, problems or concerns.

## 2019-06-14 NOTE — Progress Notes (Signed)
CARDIOLOGY OFFICE NOTE  Date:  06/26/2019    Rodney Black Date of Birth: Jan 18, 1966 Medical Record #[id number]  PCP:  Patient, [name]  Cardiologist:  Servando Snare & [facility]   Chief Complaint  Patient presents with  . Follow-up    Seen for Dr. Marlou Porch    History of Present Illness: Rodney Black is a 54 y.o. male who presents today for a 3 week check.  Seen for Dr. Marlou Porch.   He has a story of prior atrial flutter with prior ablation, ongoing alcohol and tobacco abuse, bipolar disorder and was admitted in February 2021 with elevated troponin non-ST elevation myocardial infarction with nuclear stress test showing possible inferior wall ischemia. Cath showed mid LAD 35% stenosed - to be managed medically. Cholesterol medicine started. He had not previously seen a physician for a few years and has no insurance.   Last seen in March by Dr. Marlou Porch - felt to be doing ok. BP still up - Coreg and ACE were increased. I then saw him a few weeks ago - was doing ok - gave the ok to return back to work. He did not think he would be able to stop smoking - had already gained weight - still drinking alcohol. Was interested in trying to get help thru AA. Asking about Antabuse as well. I worry about medication compliance given his social situation. I added Norvasc.   The patient does not have symptoms concerning for COVID-19 infection (fever, chills, cough, or new shortness of breath).   Comes in today. Here alone. BP is high. He does not think he has a job anymore - has not heard from his employer. 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 But he figures he does not have a job. He did get his AA book and started reading. Still smoking and drinking. His quantity has not changed. He is trying to get to primary care - going to pick up paperwork to see Dr. Maebelle Munroe. No chest pain. Eats lots of fast food. Probably way too much salt. He is rather fixated on the few  pounds he has gained. No swelling. Breathing is ok.   Past Medical History:  Diagnosis Date  . Alcohol abuse   . Atrial flutter with rapid ventricular response ([place]) 03/27/2014   unknown duration, newly diagnosed 03/2014  . Bipolar 1 disorder ([place])   . Manic depression ([place])   . Tobacco abuse     Past Surgical History:  Procedure Laterality Date  . arm fracture     when patient was young  . ATRIAL FLUTTER ABLATION N/A 03/30/2014   Procedure: ATRIAL FLUTTER ABLATION;  Surgeon: Evans Lance, MD;  Location: [facility] CATH LAB;  Service: Cardiovascular;  Laterality: N/A;  . LEFT HEART CATH AND CORONARY ANGIOGRAPHY N/A 04/26/2019   Procedure: LEFT HEART CATH AND CORONARY ANGIOGRAPHY;  Surgeon: Troy Sine, MD;  Location: [place] CV LAB;  Service: Cardiovascular;  Laterality: N/A;  . TEE WITHOUT CARDIOVERSION N/A 03/30/2014   Procedure: TRANSESOPHAGEAL ECHOCARDIOGRAM (TEE);  Surgeon: Larey Dresser, MD;  Location: [facility] ENDOSCOPY;  Service: Cardiovascular;  Laterality: N/A;     Medications: Current Meds  Medication Sig  . aspirin EC 81 MG tablet Take 1 tablet (81 mg total) by mouth daily.  Marland Kitchen atorvastatin (LIPITOR) 40 MG tablet Take 1 tablet (40 mg total) by mouth daily at 6 PM.  . lisinopril (ZESTRIL) 20 MG tablet  Take 1 tablet (20 mg total) by mouth daily.  . [DISCONTINUED] amLODipine (NORVASC) 5 MG tablet Take 1 tablet (5 mg total) by mouth daily.  . [DISCONTINUED] carvedilol (COREG) 6.25 MG tablet Take 1 tablet (6.25 mg total) by mouth 2 (two) times daily.     Allergies: No Known Allergies  Social History: The patient  reports that he has been smoking. He has never used smokeless tobacco. He reports current alcohol use. He reports current drug use.   Family History: The patient's family history includes Heart attack (age of onset: 66) in his mother; Heart disease in his mother; Hypertension in his mother; Lung cancer in his father.   Review of Systems: Please see the history of  present illness.   All other systems are reviewed and negative.   Physical Exam: VS:  BP (!) 160/90   Pulse 78   Ht 5\' 8"  (1.727 m)   Wt 222 lb 12.8 oz (101.1 kg)   SpO2 94%   BMI 33.88 kg/m  .  BMI Body mass index is 33.88 kg/m.  Wt Readings from Last 3 Encounters:  06/26/19 222 lb 12.8 oz (101.1 kg)  06/14/19 219 lb (99.3 kg)  05/11/19 215 lb (97.5 kg)   BP is 170/100 by me.  General: Alert and in no acute distress. Very talkative. Looks much older than his stated age.  Neck: Supple, no JVD, carotid bruits, or masses noted.  Cardiac: Regular rate and rhythm. No murmurs, rubs, or gallops. No edema.  Respiratory:  Decreased breath sounds.  GI: Soft and nontender.  MS: No deformity or atrophy. Gait and ROM intact.  Skin: Warm and dry. Color is flushed.  Neuro:  Strength and sensation are intact and no gross focal deficits noted.  Psych: Alert, appropriate and with normal affect.   LABORATORY DATA:  EKG:  EKG is not ordered today.    Lab Results  Component Value Date   WBC 7.8 04/22/2019   HGB 17.2 (H) 04/22/2019   HCT 49.8 04/22/2019   PLT 214 04/22/2019   GLUCOSE 113 (H) 06/14/2019   CHOL 206 (H) 04/21/2019   TRIG 132 04/21/2019   HDL 40 (L) 04/21/2019   LDLCALC 140 (H) 04/21/2019   ALT 73 (H) 06/14/2019   AST 41 (H) 06/14/2019   NA 143 06/14/2019   K 4.6 06/14/2019   CL 105 06/14/2019   CREATININE 1.04 06/14/2019   BUN 12 06/14/2019   CO2 22 06/14/2019   TSH 1.178 03/27/2014   INR 1.0 04/20/2019   HGBA1C 5.9 (H) 04/20/2019       BNP (last 3 results) No results for input(s): BNP in the last 8760 hours.  ProBNP (last 3 results) No results for input(s): PROBNP in the last 8760 hours.   Other Studies Reviewed Today:  LEFT HEART CATH AND CORONARY ANGIOGRAPHY 04/26/2019  Conclusion    Mid LAD lesion is 35% stenosed.  The left main branches into the LAD, two ramus intermediate branches and the left circumflex coronary artery. Mild nonobstructive  coronary artery disease with an area of 35% stenosis with mild calcification in the LAD with normal ramus intermediate vessels, left circumflex, and RCA.  LVEDP 5 mmHg.  RECOMMENDATION: Medical therapy. Smoking cessation is essential. Aggressive lipid-lowering therapy with target LDL less than 70.    ECHO IMPRESSIONS 03/2019  1. Left ventricular ejection fraction, by estimation, is 60 to 65%. The  left ventricle has normal function. The left ventricle has no regional  wall motion abnormalities. Left  ventricular diastolic parameters are  consistent with Grade I diastolic  dysfunction (impaired relaxation).  2. Right ventricular systolic function is normal. The right ventricular  size is normal. Tricuspid regurgitation signal is inadequate for assessing  PA pressure.  3. The mitral valve is normal in structure and function. No evidence of  mitral valve regurgitation. No evidence of mitral stenosis.  4. The aortic valve was not well visualized. Aortic valve regurgitation  is not visualized. Mild to moderate aortic valve sclerosis/calcification  is present, without any evidence of aortic stenosis.  5. The inferior vena cava is normal in size with greater than 50%  respiratory variability, suggesting right atrial pressure of 3 mmHg.     ASSESSMENT & PLAN:   1. Prior NSTEMI - has non obstructive mid LAD disease noted at cath - false + Myoview - needs aggressive CV risk factor modification - unfortunately, I do not really see this improving given his social situation. Pretty tenuous situation in my opinion.   2. Substance abuse - he is not ready to stop. Unfortunately I do not think this will change. We had a very frank conversation that his life is going to be shorter if he is not able to change his ways.   3. HTN - uncontrolled - increasing the Norvasc to 10 mg today and the Coreg to 12.5 mg BID - will need to consider diuretic on return. I am sure he gets too much salt.    4. Elevated LFTs - rechecking today.   5. Bipolar/manic/depressive - he is trying to arrange PCP - hopefully this will be able to facility psyche referral - he was on Lithium in the past.  Lots of issues with this - his twin has died, does not sound like he coped well with mother's death, etc.   6. COVID-19 Education: The signs and symptoms of COVID-19 were discussed with the patient and how to seek care for testing (follow up with PCP or arrange E-visit).  The importance of social distancing, staying at home, hand hygiene and wearing a mask when out in public were discussed today.  Current medicines are reviewed with the patient today.  The patient does not have concerns regarding medicines other than what has been noted above.  The following changes have been made:  See above.  Labs/ tests ordered today include:    Orders Placed This Encounter  Procedures  . Basic metabolic panel  . Hepatic function panel  . Lipid panel     Disposition:   FU with me in about a month.    Patient is agreeable to this plan and will call if any problems develop in the interim.   SignedNorma Fredrickson, NP  06/26/2019 11:16 AM  [facility] [address] [place], [place]  [address] Phone: [phone] Fax: [phone]

## 2019-06-14 NOTE — Progress Notes (Signed)
Heart and Vascular Care Navigation  06/14/2019  Rodney Black 1966-01-22 [id number]  Reason for Referral: CSW referred to assist patient with resources for AA.                                                                                                    Assessment: Patient lives with a roommate in a mobile home. He reports that he has a pending Medicaid application but has not heard from DSS. Patient admits to daily drinking and states he has 3 40oz bottles every evening. He states that he drinks so he can sleep at night. "if I didn't drink then I am up every 45 minutes or so and when I drink I get 6 hours of sleep".  Patient states he has been told by multiple providers that he should cut down on his drinking. Patient did attend AA meetings and asking if they are meeting in person again. Patient also inquired about obtaining the AA Blue Book.                                    HRT/VAS Care Coordination    Patients Home Cardiology Office  [facility]   Outpatient Care Team  Social Worker   Social Worker Name:  Lasandra Beech, [place] [phone]   Living arrangements for the past 2 months  Mobile Home   Lives with:  Roommate   Patient Current Insurance Coverage  Self-Pay   Patient Has Concern With Paying Medical Bills  Yes   Medical Bill Referrals:  [facility] Financial Assistance/Pending Medicaid   Does Patient Have Prescription Coverage?  No   Home Assistive Devices/Equipment  Eyeglasses   DME Agency  NA   HH Agency  NA      Social History:                                                                             SDOH Screenings   Alcohol Screen: Medium Risk  . Last Alcohol Screening Score (AUDIT): 12  Depression (PHQ2-9):   . PHQ-2 Score:   Financial Resource Strain:   . Difficulty of Paying Living Expenses:   Food Insecurity:   . Worried About Programme researcher, broadcasting/film/video in the Last Year:   . [employer] of Food in the Last Year:   Housing:   . Last Housing Risk Score:    Physical Activity:   . Days of Exercise per Week:   . Minutes of Exercise per Session:   Social Connections:   . Frequency of Communication with Friends and Family:   . Frequency of Social Gatherings with Friends and Family:   . Attends Religious Services:   . Active Member of Clubs or Organizations:   .  Attends Archivist Meetings:   Marland Kitchen Marital Status:   Stress:   . Feeling of Stress :   Tobacco Use: High Risk  . Smoking Tobacco Use: Current Every Day Smoker  . Smokeless Tobacco Use: Never Used  Transportation Needs:   . Film/video editor (Medical):   Marland Kitchen Lack of Transportation (Non-Medical):     SDOH Interventions: Financial Resources:  Financial Strain Interventions: Other (Comment)(Patient will follow up with Medicaid office for status of pending application.) DSS for financial assistance            Health Promotion Interventions:     Physical Inactivity Not addressed at this time  Smoking Cessation Discussed but patient is not ready to quit at this time         Other Care Navigation Interventions:     Inpatient/Outpatient Substance Abuse Counseling/Rehab Options CSW discussed attending AA meetings and sent patient the [place] via [employer].             Follow-up plan:  CSW provided patient with resources for AA and contact information for DSS to follow up on pending medicaid. Patient will explore resources and return call to CSW as needed. Patient grateful for the support and resources.  Raquel Sarna, [place], [place]

## 2019-06-26 ENCOUNTER — Encounter: Payer: Self-pay | Admitting: Nurse Practitioner

## 2019-06-26 ENCOUNTER — Ambulatory Visit (INDEPENDENT_AMBULATORY_CARE_PROVIDER_SITE_OTHER): Payer: Self-pay | Admitting: Nurse Practitioner

## 2019-06-26 ENCOUNTER — Other Ambulatory Visit: Payer: Self-pay

## 2019-06-26 ENCOUNTER — Other Ambulatory Visit: Payer: Self-pay | Admitting: Nurse Practitioner

## 2019-06-26 VITALS — BP 160/90 | HR 78 | Ht 68.0 in | Wt 222.8 lb

## 2019-06-26 DIAGNOSIS — I214 Non-ST elevation (NSTEMI) myocardial infarction: Secondary | ICD-10-CM

## 2019-06-26 DIAGNOSIS — Z720 Tobacco use: Secondary | ICD-10-CM

## 2019-06-26 DIAGNOSIS — I10 Essential (primary) hypertension: Secondary | ICD-10-CM

## 2019-06-26 DIAGNOSIS — E782 Mixed hyperlipidemia: Secondary | ICD-10-CM

## 2019-06-26 LAB — LIPID PANEL
Chol/HDL Ratio: 3 ratio (ref 0.0–5.0)
Cholesterol, Total: 132 mg/dL (ref 100–199)
HDL: 44 mg/dL (ref >39)
LDL Chol Calc (NIH): 64 mg/dL (ref 0–99)
Triglycerides: 139 mg/dL (ref 0–149)
VLDL Cholesterol Cal: 24 mg/dL (ref 5–40)

## 2019-06-26 LAB — HEPATIC FUNCTION PANEL
ALT: 73 IU/L — ABNORMAL HIGH (ref 0–44)
AST: 41 IU/L — ABNORMAL HIGH (ref 0–40)
Albumin: 4.3 g/dL (ref 3.8–4.9)
Alkaline Phosphatase: 92 IU/L (ref 39–117)
Bilirubin Total: 0.5 mg/dL (ref 0.0–1.2)
Bilirubin, Direct: 0.14 mg/dL (ref 0.00–0.40)
Total Protein: 6.6 g/dL (ref 6.0–8.5)

## 2019-06-26 LAB — BASIC METABOLIC PANEL
BUN/Creatinine Ratio: 13 (ref 9–20)
BUN: 12 mg/dL (ref 6–24)
CO2: 20 mmol/L (ref 20–29)
Calcium: 9.8 mg/dL (ref 8.7–10.2)
Chloride: 106 mmol/L (ref 96–106)
Creatinine, Ser: 0.94 mg/dL (ref 0.76–1.27)
GFR calc Af Amer: 107 mL/min/{1.73_m2} (ref >59)
GFR calc non Af Amer: 92 mL/min/{1.73_m2} (ref >59)
Glucose: 114 mg/dL — ABNORMAL HIGH (ref 65–99)
Potassium: 4.3 mmol/L (ref 3.5–5.2)
Sodium: 141 mmol/L (ref 134–144)

## 2019-06-26 MED ORDER — AMLODIPINE BESYLATE 10 MG PO TABS: 10.0000 mg | ORAL_TABLET | Freq: Every day | ORAL | 3 refills | Status: DC

## 2019-06-26 MED ORDER — CARVEDILOL 12.5 MG PO TABS: 12.5000 mg | ORAL_TABLET | Freq: Two times a day (BID) | ORAL | 3 refills | Status: DC

## 2019-06-26 MED FILL — CARVEDILOL 12.5 MG TABLET: 12.5 | 30 days supply | Qty: 60 | Fill #0

## 2019-06-26 MED FILL — AMLODIPINE BESYLATE 10 MG T: 10 | 30 days supply | Qty: 60 | Fill #0

## 2019-06-26 NOTE — Patient Instructions (Addendum)
After Visit Summary:  We will be checking the following labs today - BMET, HPF and Lipids   Medication Instructions:    Continue with your current medicines. BUT  I am increasing the Amlodipine to 10 mg a day - you can take 2 of your 5 mg tablets once a day and use those up - the new RX is at your pharmacy.   I am increasing the Coreg to 12.5 mg twice a day - you can take 2 of the 6.25 mg tablets twice a day and use those up - the new RX is at your pharmacy.    If you need a refill on your cardiac medications before your next appointment, please call your pharmacy.     Testing/Procedures To Be Arranged:  N/A  Follow-Up:   See me in about a month    At [facility], you and your health needs are our priority.  As part of our continuing mission to provide you with exceptional heart care, we have created designated Provider Care Teams.  These Care Teams include your primary Cardiologist (physician) and Advanced Practice Providers (APPs -  Physician Assistants and Nurse Practitioners) who all work together to provide you with the care you need, when you need it.  Special Instructions:  . Stay safe, stay home, wash your hands for at least 20 seconds and wear a mask when out in public.  . It was good to talk with you today.  . Think about what we talked about today.    Call the [facility] office at [phone] if you have any questions, problems or concerns.

## 2019-06-27 ENCOUNTER — Telehealth: Payer: Self-pay | Admitting: Cardiology

## 2019-06-27 NOTE — Telephone Encounter (Signed)
Patient returning Danielle's call in regards to lab results. °

## 2019-07-12 MED FILL — ATORVASTATIN CALCIUM 40 MG: 40 | 30 days supply | Qty: 30 | Fill #1

## 2019-08-09 NOTE — Progress Notes (Signed)
CARDIOLOGY OFFICE NOTE  Date:  08/14/2019    [name] Date of Birth: 06-Dec-1965 Medical Record #[id number]  PCP:  Laruth Bouchard, MD  Cardiologist:  Rick Duff    Chief Complaint  Patient presents with  . Follow-up    History of Present Illness: Rodney Black is a 54 y.o. male who presents today for a 6 week check.  Seen for Dr. Anne Fu.   He has a story of prior atrial flutterwith prior ablation,ongoingalcohol and tobacco abuse, bipolar disorderand was admitted inFebruary 2021 with elevated troponin non-ST elevation myocardial infarction with nuclear stress test showing possible inferior wall ischemia.Cath showed mid LAD 35% stenosed - to be managed medically. Cholesterol medicine started.He had not previously seen a physician for a few years and has no insurance.  Last seen in March 2021 by Dr. Anne Fu- felt to be doing ok. BP still up - Coreg and ACE were increased.I then saw him a few weeks later - was doing ok - gave the ok to return back to work. He did not think he would be able to stop smoking - had already gained weight - still drinking alcohol. Was interested in trying to get help thru AA. Asking about Antabuse as well. I worry about medication compliance given his social situation. I added Norvasc. Last seen by me in early May - BP was high - unclear status about his job despite [place] giving him a note. Still smoking and consuming alcohol. Lots of fast food.   The patient does not have symptoms concerning for COVID-19 infection (fever, chills, cough, or new shortness of breath).   Comes in today. Here alone. He is back working at [employer]. Notes with working 7 hours, his legs will hurt. He is fine when working 5 hours. Compression stockings do help. His legs don't hurt with walking otherwise - he feels this is from walking on cement floors. Still smoking and drinking. He is not ready to stop. No chest pain. He can get to primary care - just needs to  make an appointment. He is taking his medicines. BP looks better.   Past Medical History:  Diagnosis Date  . A-fib (HCC) 03/27/2014  . Abnormal nuclear stress test   . ACS (acute coronary syndrome) (HCC) 04/20/2019  . Alcohol abuse   . Alcohol dependence (HCC) 03/27/2014  . Alcohol use 05/11/2019  . Atrial flutter with rapid ventricular response (HCC) 03/27/2014   unknown duration, newly diagnosed 03/2014  . Bipolar 1 disorder (HCC)   . Manic depression (HCC)   . Mood disorder (HCC) 04/05/2014  . Non-ST elevation (NSTEMI) myocardial infarction (HCC) 05/11/2019  . Tobacco abuse     Past Surgical History:  Procedure Laterality Date  . arm fracture     when patient was young  . ATRIAL FLUTTER ABLATION N/A 03/30/2014   Procedure: ATRIAL FLUTTER ABLATION;  Surgeon: [name], MD;  Location: [facility] CATH LAB;  Service: Cardiovascular;  Laterality: N/A;  . LEFT HEART CATH AND CORONARY ANGIOGRAPHY N/A 04/26/2019   Procedure: LEFT HEART CATH AND CORONARY ANGIOGRAPHY;  Surgeon: Lennette Bihari, MD;  Location: [facility] INVASIVE CV LAB;  Service: Cardiovascular;  Laterality: N/A;  . TEE WITHOUT CARDIOVERSION N/A 03/30/2014   Procedure: TRANSESOPHAGEAL ECHOCARDIOGRAM (TEE);  Surgeon: [name], MD;  Location: [facility] ENDOSCOPY;  Service: Cardiovascular;  Laterality: N/A;     Medications: Current Meds  Medication Sig  . amLODipine (NORVASC) 10 MG tablet Take 1 tablet (10 mg  total) by mouth daily.  Marland Kitchen aspirin EC 81 MG tablet Take 1 tablet (81 mg total) by mouth daily.  Marland Kitchen atorvastatin (LIPITOR) 40 MG tablet Take 1 tablet (40 mg total) by mouth daily at 6 PM.  . carvedilol (COREG) 12.5 MG tablet Take 1 tablet (12.5 mg total) by mouth 2 (two) times daily.  Marland Kitchen lisinopril (ZESTRIL) 20 MG tablet Take 1 tablet (20 mg total) by mouth daily.     Allergies: No Known Allergies  Social History: The patient  reports that he has been smoking. He has never used smokeless tobacco. He reports current alcohol use. He reports  current drug use.   Family History: The patient's family history includes Heart attack (age of onset: 26) in his mother; Heart disease in his mother; Hypertension in his mother; Lung cancer in his father.   Review of Systems: Please see the history of present illness.   All other systems are reviewed and negative.   Physical Exam: VS:  BP 114/82   Pulse 62   Ht 5\' 8"  (1.727 m)   Wt 219 lb 6.4 oz (99.5 kg)   SpO2 96%   BMI 33.36 kg/m  .  BMI Body mass index is 33.36 kg/m.  Wt Readings from Last 3 Encounters:  08/14/19 219 lb 6.4 oz (99.5 kg)  06/26/19 222 lb 12.8 oz (101.1 kg)  06/14/19 219 lb (99.3 kg)    General: Alert and in no acute distress. Smells heavily of tobacco. Talkative.  Cardiac: Heart tones are distant.  No edema.  Respiratory:  Lungs are coarse but with normal work of breathing.  GI: Soft and nontender.  MS: No deformity or atrophy. Gait and ROM intact.  Skin: Warm and dry. Color is normal.  Neuro:  Strength and sensation are intact and no gross focal deficits noted.  Psych: Alert, appropriate and with normal affect.   LABORATORY DATA:  EKG:  EKG is not ordered today.   Lab Results  Component Value Date   WBC 7.8 04/22/2019   HGB 17.2 (H) 04/22/2019   HCT 49.8 04/22/2019   PLT 214 04/22/2019   GLUCOSE 114 (H) 06/26/2019   CHOL 132 06/26/2019   TRIG 139 06/26/2019   HDL 44 06/26/2019   LDLCALC 64 06/26/2019   ALT 73 (H) 06/26/2019   AST 41 (H) 06/26/2019   NA 141 06/26/2019   K 4.3 06/26/2019   CL 106 06/26/2019   CREATININE 0.94 06/26/2019   BUN 12 06/26/2019   CO2 20 06/26/2019   TSH 1.178 03/27/2014   INR 1.0 04/20/2019   HGBA1C 5.9 (H) 04/20/2019     BNP (last 3 results) No results for input(s): BNP in the last 8760 hours.  ProBNP (last 3 results) No results for input(s): PROBNP in the last 8760 hours.   Other Studies Reviewed Today:  LEFT HEART CATH AND CORONARY ANGIOGRAPHY3/04/2019  Conclusion    Mid LAD lesion is 35%  stenosed.  The left main branches into the LAD, two ramus intermediate branches and the left circumflex coronary artery. Mild nonobstructive coronary artery disease with an area of 35% stenosis with mild calcification in the LAD with normal ramus intermediate vessels, left circumflex, and RCA.  LVEDP 5 mmHg.  RECOMMENDATION: Medical therapy. Smoking cessation is essential. Aggressive lipid-lowering therapy with target LDL less than 70.    ECHOIMPRESSIONS2/2021  1. Left ventricular ejection fraction, by estimation, is 60 to 65%. The  left ventricle has normal function. The left ventricle has no regional  wall  motion abnormalities. Left ventricular diastolic parameters are  consistent with Grade I diastolic  dysfunction (impaired relaxation).  2. Right ventricular systolic function is normal. The right ventricular  size is normal. Tricuspid regurgitation signal is inadequate for assessing  PA pressure.  3. The mitral valve is normal in structure and function. No evidence of  mitral valve regurgitation. No evidence of mitral stenosis.  4. The aortic valve was not well visualized. Aortic valve regurgitation  is not visualized. Mild to moderate aortic valve sclerosis/calcification  is present, without any evidence of aortic stenosis.  5. The inferior vena cava is normal in size with greater than 50%  respiratory variability, suggesting right atrial pressure of 3 mmHg.     ASSESSMENT& PLAN:   1. Prior NSTEMI - non obstructive mid LAD disease - had false + Myoview - he is managed medically. Symptoms are stable. He has a very poor social situation - continued substance abuse.   2. Substance abuse - not ready to stop yet either his cigarettes or his alcohol.   3. HTN - BP is much better - looks to be taking his medicines as prescribed.   4. Elevated LFTs - rechecking today. I suspect this is from long standing alcohol.   5. Bipolar/manic/depressive - trying to get  to PCP to arrange psyche referral. Says he can make an appointment now.   Current medicines are reviewed with the patient today.  The patient does not have concerns regarding medicines other than what has been noted above.  The following changes have been made:  See above.  Labs/ tests ordered today include:    Orders Placed This Encounter  Procedures  . Hepatic function panel     Disposition:   FU with me in 3 months.   Patient is agreeable to this plan and will call if any problems develop in the interim.   SignedTruitt Merle, NP  08/14/2019 9:01 AM  [facility] [address] [place],   [address] Phone: [phone] Fax: [phone]

## 2019-08-14 ENCOUNTER — Ambulatory Visit (INDEPENDENT_AMBULATORY_CARE_PROVIDER_SITE_OTHER): Payer: Self-pay | Admitting: Nurse Practitioner

## 2019-08-14 ENCOUNTER — Other Ambulatory Visit: Payer: Self-pay

## 2019-08-14 ENCOUNTER — Encounter: Payer: Self-pay | Admitting: Nurse Practitioner

## 2019-08-14 VITALS — BP 114/82 | HR 62 | Ht 68.0 in | Wt 219.4 lb

## 2019-08-14 DIAGNOSIS — I214 Non-ST elevation (NSTEMI) myocardial infarction: Secondary | ICD-10-CM

## 2019-08-14 DIAGNOSIS — E782 Mixed hyperlipidemia: Secondary | ICD-10-CM

## 2019-08-14 DIAGNOSIS — I10 Essential (primary) hypertension: Secondary | ICD-10-CM

## 2019-08-14 DIAGNOSIS — R7989 Other specified abnormal findings of blood chemistry: Secondary | ICD-10-CM

## 2019-08-14 DIAGNOSIS — Z7289 Other problems related to lifestyle: Secondary | ICD-10-CM

## 2019-08-14 DIAGNOSIS — Z789 Other specified health status: Secondary | ICD-10-CM

## 2019-08-14 DIAGNOSIS — Z720 Tobacco use: Secondary | ICD-10-CM

## 2019-08-14 LAB — HEPATIC FUNCTION PANEL
ALT: 75 IU/L — ABNORMAL HIGH (ref 0–44)
AST: 37 IU/L (ref 0–40)
Albumin: 4.4 g/dL (ref 3.8–4.9)
Alkaline Phosphatase: 84 IU/L (ref 48–121)
Bilirubin Total: 1 mg/dL (ref 0.0–1.2)
Bilirubin, Direct: 0.25 mg/dL (ref 0.00–0.40)
Total Protein: 7.1 g/dL (ref 6.0–8.5)

## 2019-08-14 NOTE — Patient Instructions (Addendum)
After Visit Summary:  We will be checking the following labs today - HPF   Medication Instructions:    Continue with your current medicines.    If you need a refill on your cardiac medications before your next appointment, please call your pharmacy.     Testing/Procedures To Be Arranged:  N/A  Follow-Up:   See me in about 3 months.     At [facility], you and your health needs are our priority.  As part of our continuing mission to provide you with exceptional heart care, we have created designated Provider Care Teams.  These Care Teams include your primary Cardiologist (physician) and Advanced Practice Providers (APPs -  Physician Assistants and Nurse Practitioners) who all work together to provide you with the care you need, when you need it.  Special Instructions:  . Stay safe, wash your hands for at least 20 seconds and wear a mask when needed.  . It was good to talk with you today.  . Make an appointment with primary care - does not matter who you see.    Call the [facility] office at [phone] if you have any questions, problems or concerns.

## 2019-08-21 ENCOUNTER — Encounter: Payer: Self-pay | Admitting: *Deleted

## 2019-08-31 ENCOUNTER — Other Ambulatory Visit: Payer: Self-pay | Admitting: *Deleted

## 2019-08-31 ENCOUNTER — Telehealth: Payer: Self-pay | Admitting: Cardiology

## 2019-08-31 MED ORDER — ATORVASTATIN CALCIUM 40 MG PO TABS: 20.0000 mg | ORAL_TABLET | Freq: Every day | ORAL | 3 refills | Status: DC

## 2019-08-31 NOTE — Telephone Encounter (Signed)
Spoke with pt - phone number listed in chart is incorrect.  Obtained correct # and will update.  Pt is aware of LFTs and Lori's order to decrease his Lipitor to 20 mg a day.  He prefers to cut the 40 mg tablets in half at this time.  He is aware he will have repeat lab work at his next office visit.  He will c/b with any questions or concerns.

## 2019-08-31 NOTE — Telephone Encounter (Signed)
Have attempted multiple times to reach pt at number listed.  Receive a automated message stating call can not be completed at this time.  Will continue to attempt to reach pt by phone.

## 2019-08-31 NOTE — Telephone Encounter (Signed)
New message  Patient is calling in to go over his results. Please give patient a call back .

## 2019-08-31 NOTE — Telephone Encounter (Signed)
Patient returning call.

## 2019-08-31 NOTE — Telephone Encounter (Signed)
Attempted to call pt 3 different times and recording comes on stating unable to complete as dialed please try again .Will try later .Zack Seal

## 2019-09-06 MED FILL — ATORVASTATIN CALCIUM 40 MG: 40 | 30 days supply | Qty: 30 | Fill #3

## 2019-09-06 MED FILL — CARVEDILOL 12.5 MG TABLET: 12.5 | 30 days supply | Qty: 60 | Fill #2

## 2019-09-12 ENCOUNTER — Telehealth: Payer: Self-pay | Admitting: Cardiology

## 2019-09-12 NOTE — Telephone Encounter (Signed)
Pt c/o Shortness Of Breath: STAT if SOB developed within the last 24 hours or pt is noticeably SOB on the phone  1. Are you currently SOB (can you hear that pt is SOB on the phone)? No  2. How long have you been experiencing SOB? Patient states SOB began at work around 10:30 AM today, 09/12/19, however he went home shortly after and has not had the SOB again  3. Are you SOB when sitting or when up moving around? When up and moving around  4. Are you currently experiencing any other symptoms? Diarrhea, sweating

## 2019-09-12 NOTE — Telephone Encounter (Signed)
lvm with Lori's recommendation's.  

## 2019-09-12 NOTE — Telephone Encounter (Signed)
I do not think further evaluation at this time is warranted - N/V/Diarrhea probably from the food he ate.   Needs to stay hydrated.   Will see back as planned.   Rosalio Macadamia, RN, ANP-C [facility] [address] [place], [place]  [address] [phone]

## 2019-09-12 NOTE — Telephone Encounter (Signed)
Returned call to pt who asked if Lawson Fiscal needs to see him.  Advised pt I do not have enough information based on the message I received to determine his need for further evaluation.  Pt reports he went out to eat Saturday night and Sunday had n/v/diarrhea all day.  He worked today and while lifting boxes became very SOB and began sweating.  He quit working and went home.  Symptoms resolved.  He has not had any more SOB since being home this afternoon.  He bluntly asked if he needs to find another job.  Advised that is not for me to determine.  Advised anyone can become SOB and sweaty lifting heavy items or doing work you are not used to when you are deconditioned/smoking.   Pt reports he is taking medications as listed.  He will return to work tomorrow, off Thursday and Friday.  He would like Lawson Fiscal to determine if he needs further evaluation at this time for the episode he had this AM. Will forward to her for review and advise.

## 2019-10-16 MED FILL — ATORVASTATIN CALCIUM 40 MG: 40 | 30 days supply | Qty: 30 | Fill #4

## 2019-10-16 MED FILL — AMLODIPINE BESYLATE 10 MG T: 10 | 60 days supply | Qty: 60 | Fill #2

## 2019-10-16 MED FILL — CARVEDILOL 12.5 MG TABLET: 12.5 | 30 days supply | Qty: 60 | Fill #3

## 2019-10-25 NOTE — Progress Notes (Signed)
CARDIOLOGY OFFICE NOTE  Date:  11/08/2019    Rodney Black Date of Birth: 1965-06-27 Medical Record #[id number]  PCP:  Laruth Bouchard, MD  Cardiologist:  Rick Duff  Chief Complaint  Patient presents with  . Follow-up    Seen for Dr. Anne Fu    History of Present Illness: Rodney Black is a 54 y.o. male who presents today for a follow up visit. Seen for Dr. Anne Fu.   He has a story of prior atrial flutterwith prior ablation,ongoingalcohol and tobacco abuse, bipolar disorderand was admitted inFebruary 2021 with elevated troponin non-ST elevation myocardial infarction with nuclear stress test showing possible inferior wall ischemia.Cath showed mid LAD 35% stenosed - to be managed medically. Cholesterol medicine started.He had not previously seen a physician for a few years and has no insurance.  Last seenin March 2021 byDr. Anne Fu- felt to be doing ok. BP still up - Coreg and ACE were increased.I then saw him a few weeks later - was doing ok - gave the ok to return back to work. He did not think he would be able to stop smoking - had already gained weight - still drinking alcohol. Was interested in trying to get help thru AA. Asking about Antabuse as well. I worry about medication compliance given his social situation.I added Norvasc.When seen by me in early May - BP was high - unclear status about his job despite [place] giving him a note. Still smoking and consuming alcohol. Lots of fast food. Last seen in June - he was back working at [employer] part time. Substance abuse continues.   Comes in today. Here alone. He feels good. No chest pain. Not short of breath. BP has improved. His legs hurt with walking on the concrete floors at work. Still smoking and drinking - not quantified. Taking his medicines. Not vaccinated.   Past Medical History:  Diagnosis Date  . A-fib (HCC) 03/27/2014  . Abnormal nuclear stress test   . ACS (acute coronary syndrome) (HCC)  04/20/2019  . Alcohol abuse   . Alcohol dependence (HCC) 03/27/2014  . Alcohol use 05/11/2019  . Atrial flutter with rapid ventricular response (HCC) 03/27/2014   unknown duration, newly diagnosed 03/2014  . Bipolar 1 disorder (HCC)   . Manic depression (HCC)   . Mood disorder (HCC) 04/05/2014  . Non-ST elevation (NSTEMI) myocardial infarction (HCC) 05/11/2019  . Tobacco abuse     Past Surgical History:  Procedure Laterality Date  . arm fracture     when patient was young  . ATRIAL FLUTTER ABLATION N/A 03/30/2014   Procedure: ATRIAL FLUTTER ABLATION;  Surgeon: [name], MD;  Location: [facility] CATH LAB;  Service: Cardiovascular;  Laterality: N/A;  . LEFT HEART CATH AND CORONARY ANGIOGRAPHY N/A 04/26/2019   Procedure: LEFT HEART CATH AND CORONARY ANGIOGRAPHY;  Surgeon: Lennette Bihari, MD;  Location: [facility] INVASIVE CV LAB;  Service: Cardiovascular;  Laterality: N/A;  . TEE WITHOUT CARDIOVERSION N/A 03/30/2014   Procedure: TRANSESOPHAGEAL ECHOCARDIOGRAM (TEE);  Surgeon: [name], MD;  Location: [facility] ENDOSCOPY;  Service: Cardiovascular;  Laterality: N/A;     Medications: Current Meds  Medication Sig  . aspirin EC 81 MG tablet Take 1 tablet (81 mg total) by mouth daily.  Marland Kitchen atorvastatin (LIPITOR) 40 MG tablet Take 0.5 tablets (20 mg total) by mouth daily at 6 PM.  . lisinopril (ZESTRIL) 20 MG tablet Take 1 tablet (20 mg total) by mouth daily.     Allergies: No  Known Allergies  Social History: The patient  reports that he has been smoking. He has never used smokeless tobacco. He reports current alcohol use. He reports current drug use.   Family History: The patient's family history includes Heart attack (age of onset: 30) in his mother; Heart disease in his mother; Hypertension in his mother; Lung cancer in his father.   Review of Systems: Please see the history of present illness.   All other systems are reviewed and negative.   Physical Exam: VS:  BP 120/84   Pulse 64   Ht 5\' 8"   (1.727 m)   Wt 213 lb 12.8 oz (97 kg)   SpO2 97%   BMI 32.51 kg/m  .  BMI Body mass index is 32.51 kg/m.  Wt Readings from Last 3 Encounters:  11/08/19 213 lb 12.8 oz (97 kg)  08/14/19 219 lb 6.4 oz (99.5 kg)  06/26/19 222 lb 12.8 oz (101.1 kg)    General: Alert and in no acute distress.   Cardiac: Regular rate and rhythm. No murmurs, rubs, or gallops. No edema.  Respiratory:  Lungs are coarse with normal work of breathing.  GI: Soft and nontender.  MS: No deformity or atrophy. Gait and ROM intact.  Skin: Warm and dry. Color is normal.  Neuro:  Strength and sensation are intact and no gross focal deficits noted.  Psych: Alert, appropriate and with normal affect.   LABORATORY DATA:  EKG:  EKG is not ordered today.   Lab Results  Component Value Date   WBC 7.8 04/22/2019   HGB 17.2 (H) 04/22/2019   HCT 49.8 04/22/2019   PLT 214 04/22/2019   GLUCOSE 114 (H) 06/26/2019   CHOL 132 06/26/2019   TRIG 139 06/26/2019   HDL 44 06/26/2019   LDLCALC 64 06/26/2019   ALT 75 (H) 08/14/2019   AST 37 08/14/2019   NA 141 06/26/2019   K 4.3 06/26/2019   CL 106 06/26/2019   CREATININE 0.94 06/26/2019   BUN 12 06/26/2019   CO2 20 06/26/2019   TSH 1.178 03/27/2014   INR 1.0 04/20/2019   HGBA1C 5.9 (H) 04/20/2019     BNP (last 3 results) No results for input(s): BNP in the last 8760 hours.  ProBNP (last 3 results) No results for input(s): PROBNP in the last 8760 hours.   Other Studies Reviewed Today:  LEFT HEART CATH AND CORONARY ANGIOGRAPHY3/04/2019  Conclusion    Mid LAD lesion is 35% stenosed.  The left main branches into the LAD, two ramus intermediate branches and the left circumflex coronary artery. Mild nonobstructive coronary artery disease with an area of 35% stenosis with mild calcification in the LAD with normal ramus intermediate vessels, left circumflex, and RCA.  LVEDP 5 mmHg.  RECOMMENDATION: Medical therapy. Smoking cessation is essential.  Aggressive lipid-lowering therapy with target LDL less than 70.    ECHOIMPRESSIONS2/2021  1. Left ventricular ejection fraction, by estimation, is 60 to 65%. The  left ventricle has normal function. The left ventricle has no regional  wall motion abnormalities. Left ventricular diastolic parameters are  consistent with Grade I diastolic  dysfunction (impaired relaxation).  2. Right ventricular systolic function is normal. The right ventricular  size is normal. Tricuspid regurgitation signal is inadequate for assessing  PA pressure.  3. The mitral valve is normal in structure and function. No evidence of  mitral valve regurgitation. No evidence of mitral stenosis.  4. The aortic valve was not well visualized. Aortic valve regurgitation  is not  visualized. Mild to moderate aortic valve sclerosis/calcification  is present, without any evidence of aortic stenosis.  5. The inferior vena cava is normal in size with greater than 50%  respiratory variability, suggesting right atrial pressure of 3 mmHg.     ASSESSMENT& PLAN:   1. Prior NSTEMI - non obstructive mid LAD disease noted on cath - after false + Myoview. He is managed medically. No active chest pain.   2. HTN - BP looks fine - continue Norvasc, Coreg and Lisinopril.   3. HLD - on statin   4. Mild elevation in LFTs - rechecking today.   5. Multi-substance abuse - I do not really see this changing - he understands the consequences.   6. Bipolar/manic/depressive - hopefully to see PCP next month and get psyche referral started.   7. Leg pain - has 2+ pedal pulses bilaterally - I suspect this is more from walking on concrete floor.   Current medicines are reviewed with the patient today.  The patient does not have concerns regarding medicines other than what has been noted above.  The following changes have been made:  See above.  Labs/ tests ordered today include:    Orders Placed This Encounter    Procedures  . Basic metabolic panel  . CBC  . Hepatic function panel     Disposition:   FU with [place] in 4 months.     Patient is agreeable to this plan and will call if any problems develop in the interim.   SignedNorma Fredrickson, NP  11/08/2019 9:05 AM  [facility] [address] [place], [place]  [address] Phone: [phone] Fax: [phone]

## 2019-11-08 ENCOUNTER — Other Ambulatory Visit: Payer: Self-pay

## 2019-11-08 ENCOUNTER — Ambulatory Visit (INDEPENDENT_AMBULATORY_CARE_PROVIDER_SITE_OTHER): Payer: Self-pay | Admitting: Nurse Practitioner

## 2019-11-08 ENCOUNTER — Encounter: Payer: Self-pay | Admitting: Nurse Practitioner

## 2019-11-08 VITALS — BP 120/84 | HR 64 | Ht 68.0 in | Wt 213.8 lb

## 2019-11-08 DIAGNOSIS — Z789 Other specified health status: Secondary | ICD-10-CM

## 2019-11-08 DIAGNOSIS — I10 Essential (primary) hypertension: Secondary | ICD-10-CM

## 2019-11-08 DIAGNOSIS — F1090 Alcohol use, unspecified, uncomplicated: Secondary | ICD-10-CM

## 2019-11-08 DIAGNOSIS — Z7289 Other problems related to lifestyle: Secondary | ICD-10-CM

## 2019-11-08 DIAGNOSIS — Z720 Tobacco use: Secondary | ICD-10-CM

## 2019-11-08 DIAGNOSIS — R7989 Other specified abnormal findings of blood chemistry: Secondary | ICD-10-CM

## 2019-11-08 DIAGNOSIS — E782 Mixed hyperlipidemia: Secondary | ICD-10-CM

## 2019-11-08 LAB — CBC
Hematocrit: 48.9 % (ref 37.5–51.0)
Hemoglobin: 17.3 g/dL (ref 13.0–17.7)
MCH: 34.3 pg — ABNORMAL HIGH (ref 26.6–33.0)
MCHC: 35.4 g/dL (ref 31.5–35.7)
MCV: 97 fL (ref 79–97)
Platelets: 253 10*3/uL (ref 150–450)
RBC: 5.04 x10E6/uL (ref 4.14–5.80)
RDW: 12.4 % (ref 11.6–15.4)
WBC: 6.5 10*3/uL (ref 3.4–10.8)

## 2019-11-08 LAB — BASIC METABOLIC PANEL
BUN/Creatinine Ratio: 10 (ref 9–20)
BUN: 9 mg/dL (ref 6–24)
CO2: 21 mmol/L (ref 20–29)
Calcium: 9.5 mg/dL (ref 8.7–10.2)
Chloride: 102 mmol/L (ref 96–106)
Creatinine, Ser: 0.87 mg/dL (ref 0.76–1.27)
GFR calc Af Amer: 114 mL/min/{1.73_m2} (ref >59)
GFR calc non Af Amer: 99 mL/min/{1.73_m2} (ref >59)
Glucose: 120 mg/dL — ABNORMAL HIGH (ref 65–99)
Potassium: 4.5 mmol/L (ref 3.5–5.2)
Sodium: 138 mmol/L (ref 134–144)

## 2019-11-08 LAB — HEPATIC FUNCTION PANEL
ALT: 62 IU/L — ABNORMAL HIGH (ref 0–44)
AST: 34 IU/L (ref 0–40)
Albumin: 4.5 g/dL (ref 3.8–4.9)
Alkaline Phosphatase: 82 IU/L (ref 44–121)
Bilirubin Total: 0.8 mg/dL (ref 0.0–1.2)
Bilirubin, Direct: 0.27 mg/dL (ref 0.00–0.40)
Total Protein: 7 g/dL (ref 6.0–8.5)

## 2019-11-08 NOTE — Patient Instructions (Addendum)
After Visit Summary:  We will be checking the following labs today - BMET, CBC and HPF   Medication Instructions:    Continue with your current medicines.    If you need a refill on your cardiac medications before your next appointment, please call your pharmacy.     Testing/Procedures To Be Arranged:  N/A  Follow-Up:   See me in 4 months.     At [facility], you and your health needs are our priority.  As part of our continuing mission to provide you with exceptional heart care, we have created designated Provider Care Teams.  These Care Teams include your primary Cardiologist (physician) and Advanced Practice Providers (APPs -  Physician Assistants and Nurse Practitioners) who all work together to provide you with the care you need, when you need it.  Special Instructions:  . Stay safe, wash your hands for at least 20 seconds and wear a mask when needed.  . It was good to talk with you today.  . Go get vaccinated!   Call the [facility] office at [phone] if you have any questions, problems or concerns.

## 2019-11-21 MED FILL — ATORVASTATIN CALCIUM 40 MG: 40 | 30 days supply | Qty: 30 | Fill #5

## 2019-11-21 MED FILL — CARVEDILOL 12.5 MG TABLET: 12.5 | 30 days supply | Qty: 60 | Fill #4

## 2020-01-04 MED FILL — AMLODIPINE BESYLATE 10 MG T: 10 | 30 days supply | Qty: 30 | Fill #3

## 2020-01-04 MED FILL — CARVEDILOL 12.5 MG TABLET: 12.5 | 30 days supply | Qty: 60 | Fill #5

## 2020-02-07 MED FILL — ?ATORVASTATIN 40MG TABLET: 40 | 30 days supply | Qty: 30 | Fill #6

## 2020-02-07 MED FILL — AMLODIPINE BESYLATE 10 MG T: 10 | 30 days supply | Qty: 30 | Fill #4

## 2020-02-07 MED FILL — CARVEDILOL 12.5 MG TABLET: 12.5 | 30 days supply | Qty: 60 | Fill #6

## 2020-02-28 NOTE — Progress Notes (Addendum)
CARDIOLOGY OFFICE NOTE  Date:  03/04/2020    [name] Date of Birth: 05/17/1965 Medical Record #[id number]  PCP: None  Cardiologist:  Rick Duff  Chief Complaint  Patient presents with  . Follow-up    Seen for Dr. Anne Fu    History of Present Illness: Rodney Black is a 55 y.o. male who presents today for a follow up visit. Seen for Dr. Anne Fu.    He has a story of prior atrial flutter with prior ablation, ongoing alcohol and tobacco abuse, bipolar disorder and was admitted in February 2021 with elevated troponin non-ST elevation myocardial infarction with nuclear stress test showing possible inferior wall ischemia. Cath showed mid LAD 35% stenosed - to be managed medically. Cholesterol medicine started. He had not previously seen a physician for a few years and has no insurance.    Last seen in March 2021 by Dr. Anne Fu - felt to be doing ok. BP still up - Coreg and ACE were increased. I then saw him a few weeks later - was doing ok - gave the ok to return back to work. He did not think he would be able to stop smoking - had already gained weight - still drinking alcohol. Was interested in trying to get help thru AA. Asking about Antabuse as well. I worry about medication compliance given his social situation. I added Norvasc. When seen by me in early May - BP was high - unclear status about his job despite [place] giving him a note. Still smoking and consuming alcohol. Lots of fast food. Last seen in September - he was back working - substance abuse continues. BP was better. Not vaccinated.   Comes in today. Here alone. Substance abuse continues. He does not know his medicines but sounds like he is taking everything though. He helps his room mate - who has MS. Weight is up. Not vaccinated for COVID 19. He has cut down on his beer - is surprised that his weight is up. No chest pain. Not short of breath. He has cut his hours at work - working about 10 hours a week. Legs feel  better since walking less on the concrete floor.    Past Medical History:  Diagnosis Date  . A-fib (HCC) 03/27/2014  . Abnormal nuclear stress test   . ACS (acute coronary syndrome) (HCC) 04/20/2019  . Alcohol abuse   . Alcohol dependence (HCC) 03/27/2014  . Alcohol use 05/11/2019  . Atrial flutter with rapid ventricular response (HCC) 03/27/2014   unknown duration, newly diagnosed 03/2014  . Bipolar 1 disorder (HCC)   . Manic depression (HCC)   . Mood disorder (HCC) 04/05/2014  . Non-ST elevation (NSTEMI) myocardial infarction (HCC) 05/11/2019  . Tobacco abuse     Past Surgical History:  Procedure Laterality Date  . arm fracture     when patient was young  . ATRIAL FLUTTER ABLATION N/A 03/30/2014   Procedure: ATRIAL FLUTTER ABLATION;  Surgeon: [name], MD;  Location: [facility] CATH LAB;  Service: Cardiovascular;  Laterality: N/A;  . LEFT HEART CATH AND CORONARY ANGIOGRAPHY N/A 04/26/2019   Procedure: LEFT HEART CATH AND CORONARY ANGIOGRAPHY;  Surgeon: Lennette Bihari, MD;  Location: [facility] INVASIVE CV LAB;  Service: Cardiovascular;  Laterality: N/A;  . TEE WITHOUT CARDIOVERSION N/A 03/30/2014   Procedure: TRANSESOPHAGEAL ECHOCARDIOGRAM (TEE);  Surgeon: [name], MD;  Location: [facility] ENDOSCOPY;  Service: Cardiovascular;  Laterality: N/A;     Medications: Current Meds  Medication Sig  . amLODipine (NORVASC) 10 MG tablet Take 1 tablet (10 mg total) by mouth daily.  Marland Kitchen aspirin EC 81 MG tablet Take 1 tablet (81 mg total) by mouth daily.  Marland Kitchen atorvastatin (LIPITOR) 40 MG tablet Take 0.5 tablets (20 mg total) by mouth daily at 6 PM.  . carvedilol (COREG) 12.5 MG tablet Take 1 tablet (12.5 mg total) by mouth 2 (two) times daily.  Marland Kitchen lisinopril (ZESTRIL) 20 MG tablet Take 1 tablet (20 mg total) by mouth daily.     Allergies: No Known Allergies  Social History: The patient  reports that he has been smoking. He has never used smokeless tobacco. He reports current alcohol use. He reports current drug  use.   Family History: The patient's family history includes Heart attack (age of onset: 71) in his mother; Heart disease in his mother; Hypertension in his mother; Lung cancer in his father.   Review of Systems: Please see the history of present illness.   All other systems are reviewed and negative.   Physical Exam: VS:  BP 120/78 (BP Location: Left Arm, Patient Position: Sitting, Cuff Size: Large)   Pulse 65   Ht 5\' 8"  (1.727 m)   Wt 227 lb (103 kg)   SpO2 95%   BMI 34.52 kg/m  .  BMI Body mass index is 34.52 kg/m.  Wt Readings from Last 3 Encounters:  03/04/20 227 lb (103 kg)  11/08/19 213 lb 12.8 oz (97 kg)  08/14/19 219 lb 6.4 oz (99.5 kg)    General: Alert and in no acute distress.  Smells very heavily of tobacco.  Cardiac: Heart tones are distant.  No edema.  Respiratory:  Lungs are coarse - he has normal work of breathing. Congested cough.  GI: Soft and nontender.  MS: No deformity or atrophy. Gait and ROM intact.  Skin: Warm and dry. Color is normal.  Neuro:  Strength and sensation are intact and no gross focal deficits noted.  Psych: Alert, appropriate and with normal affect.   LABORATORY DATA:  EKG:  EKG is not ordered today.    Lab Results  Component Value Date   WBC 6.5 11/08/2019   HGB 17.3 11/08/2019   HCT 48.9 11/08/2019   PLT 253 11/08/2019   GLUCOSE 120 (H) 11/08/2019   CHOL 132 06/26/2019   TRIG 139 06/26/2019   HDL 44 06/26/2019   LDLCALC 64 06/26/2019   ALT 62 (H) 11/08/2019   AST 34 11/08/2019   NA 138 11/08/2019   K 4.5 11/08/2019   CL 102 11/08/2019   CREATININE 0.87 11/08/2019   BUN 9 11/08/2019   CO2 21 11/08/2019   TSH 1.178 03/27/2014   INR 1.0 04/20/2019   HGBA1C 5.9 (H) 04/20/2019     BNP (last 3 results) No results for input(s): BNP in the last 8760 hours.  ProBNP (last 3 results) No results for input(s): PROBNP in the last 8760 hours.   Other Studies Reviewed Today:  LEFT HEART CATH AND CORONARY ANGIOGRAPHY  04/26/2019  Conclusion      Mid LAD lesion is 35% stenosed.   The left main branches into the LAD, two ramus intermediate branches and the left circumflex coronary artery.  Mild nonobstructive coronary artery disease with an area of 35% stenosis with mild calcification in the LAD with normal ramus intermediate vessels, left circumflex, and RCA.   LVEDP 5 mmHg.   RECOMMENDATION: Medical therapy.  Smoking cessation is essential.  Aggressive lipid-lowering therapy with target LDL  less than 70.      ECHO IMPRESSIONS 03/2019   1. Left ventricular ejection fraction, by estimation, is 60 to 65%. The  left ventricle has normal function. The left ventricle has no regional  wall motion abnormalities. Left ventricular diastolic parameters are  consistent with Grade I diastolic  dysfunction (impaired relaxation).   2. Right ventricular systolic function is normal. The right ventricular  size is normal. Tricuspid regurgitation signal is inadequate for assessing  PA pressure.   3. The mitral valve is normal in structure and function. No evidence of  mitral valve regurgitation. No evidence of mitral stenosis.   4. The aortic valve was not well visualized. Aortic valve regurgitation  is not visualized. Mild to moderate aortic valve sclerosis/calcification  is present, without any evidence of aortic stenosis.   5. The inferior vena cava is normal in size with greater than 50%  respiratory variability, suggesting right atrial pressure of 3 mmHg.        ASSESSMENT & PLAN:     1. CAD with prior NSTEMI - has non obstructive mLAD noted on cath following false + Myoview - managed medically.   2. HTN - BP looks good - I think he is probably taking his medicines - he is going to call us back with an update of what his bottles say.   3. HLD - on statin - lab today.   4. Elevated LFTs - rechecking today - suspect due to ongoing alcohol use.   5. COVID 19 - recommended vaccination.   6. Multi  substance abuse - I do not see this changing.   7. Bipolar/manic/depressive - currently back without PCP.   Current medicines are reviewed with the patient today.  The patient does not have concerns regarding medicines other than what has been noted above.  The following changes have been made:  See above.  Labs/ tests ordered today include:    Orders Placed This Encounter  Procedures  . Basic metabolic panel  . CBC  . Hepatic function panel  . Lipid panel     Disposition:   FU with Dr. Marlou Porch in 4 months. He is aware that I am leaving next month.     Patient is agreeable to this plan and will call if any problems develop in the interim.   SignedTruitt Merle, NP  03/04/2020 9:16 AM  [place] [address] [place], [place]  [address] Phone: [phone] Fax: [phone]       Addendum: 03/04/20 His medicines have been clarified - he is not on ACE - taking whole 40 mg of Lipitor.   Will see what his labs show - may need to cut statin back - will see what LFTs show.   Burtis Junes, RN, [place] [address] [place], [place]  [address] [phone]

## 2020-03-04 ENCOUNTER — Other Ambulatory Visit: Payer: Self-pay

## 2020-03-04 ENCOUNTER — Ambulatory Visit (INDEPENDENT_AMBULATORY_CARE_PROVIDER_SITE_OTHER): Payer: Self-pay | Admitting: Nurse Practitioner

## 2020-03-04 ENCOUNTER — Other Ambulatory Visit: Payer: Self-pay | Admitting: *Deleted

## 2020-03-04 ENCOUNTER — Telehealth: Payer: Self-pay | Admitting: *Deleted

## 2020-03-04 ENCOUNTER — Encounter: Payer: Self-pay | Admitting: Nurse Practitioner

## 2020-03-04 VITALS — BP 120/78 | HR 65 | Ht 68.0 in | Wt 227.0 lb

## 2020-03-04 DIAGNOSIS — I10 Essential (primary) hypertension: Secondary | ICD-10-CM

## 2020-03-04 DIAGNOSIS — R7989 Other specified abnormal findings of blood chemistry: Secondary | ICD-10-CM

## 2020-03-04 DIAGNOSIS — E782 Mixed hyperlipidemia: Secondary | ICD-10-CM

## 2020-03-04 DIAGNOSIS — Z789 Other specified health status: Secondary | ICD-10-CM

## 2020-03-04 DIAGNOSIS — Z7289 Other problems related to lifestyle: Secondary | ICD-10-CM

## 2020-03-04 DIAGNOSIS — I214 Non-ST elevation (NSTEMI) myocardial infarction: Secondary | ICD-10-CM

## 2020-03-04 DIAGNOSIS — Z720 Tobacco use: Secondary | ICD-10-CM

## 2020-03-04 LAB — LIPID PANEL
Chol/HDL Ratio: 2.8 ratio (ref 0.0–5.0)
Cholesterol, Total: 138 mg/dL (ref 100–199)
HDL: 50 mg/dL (ref >39)
LDL Chol Calc (NIH): 69 mg/dL (ref 0–99)
Triglycerides: 104 mg/dL (ref 0–149)
VLDL Cholesterol Cal: 19 mg/dL (ref 5–40)

## 2020-03-04 LAB — CBC
Hematocrit: 49.1 % (ref 37.5–51.0)
Hemoglobin: 17.5 g/dL (ref 13.0–17.7)
MCH: 33.9 pg — ABNORMAL HIGH (ref 26.6–33.0)
MCHC: 35.6 g/dL (ref 31.5–35.7)
MCV: 95 fL (ref 79–97)
Platelets: 292 10*3/uL (ref 150–450)
RBC: 5.16 x10E6/uL (ref 4.14–5.80)
RDW: 12.1 % (ref 11.6–15.4)
WBC: 8.9 10*3/uL (ref 3.4–10.8)

## 2020-03-04 LAB — BASIC METABOLIC PANEL
BUN/Creatinine Ratio: 10 (ref 9–20)
BUN: 10 mg/dL (ref 6–24)
CO2: 20 mmol/L (ref 20–29)
Calcium: 9.8 mg/dL (ref 8.7–10.2)
Chloride: 103 mmol/L (ref 96–106)
Creatinine, Ser: 0.99 mg/dL (ref 0.76–1.27)
GFR calc Af Amer: 99 mL/min/{1.73_m2} (ref >59)
GFR calc non Af Amer: 86 mL/min/{1.73_m2} (ref >59)
Glucose: 117 mg/dL — ABNORMAL HIGH (ref 65–99)
Potassium: 4.5 mmol/L (ref 3.5–5.2)
Sodium: 138 mmol/L (ref 134–144)

## 2020-03-04 LAB — HEPATIC FUNCTION PANEL
ALT: 68 IU/L — ABNORMAL HIGH (ref 0–44)
AST: 30 IU/L (ref 0–40)
Albumin: 4.5 g/dL (ref 3.8–4.9)
Alkaline Phosphatase: 83 IU/L (ref 44–121)
Bilirubin Total: 0.9 mg/dL (ref 0.0–1.2)
Bilirubin, Direct: 0.25 mg/dL (ref 0.00–0.40)
Total Protein: 6.8 g/dL (ref 6.0–8.5)

## 2020-03-04 MED ORDER — ATORVASTATIN CALCIUM 40 MG PO TABS: 40.0000 mg | ORAL_TABLET | Freq: Every day | ORAL | 3 refills | Status: DC

## 2020-03-04 NOTE — Telephone Encounter (Signed)
Noted - will see what his labs show and will addend my note.   Rodney Black

## 2020-03-04 NOTE — Patient Instructions (Addendum)
After Visit Summary:  We will be checking the following labs today - BMET, CBC, HPF and Lipids   Medication Instructions:    Continue with your current medicines.   Call us and let us know your medicines.    If you need a refill on your cardiac medications before your next appointment, please call your pharmacy.     Testing/Procedures To Be Arranged:  N/A  Follow-Up:   See Dr. Anne Fu in 4 months.     At [facility], you and your health needs are our priority.  As part of our continuing mission to provide you with exceptional heart care, we have created designated Provider Care Teams.  These Care Teams include your primary Cardiologist (physician) and Advanced Practice Providers (APPs -  Physician Assistants and Nurse Practitioners) who all work together to provide you with the care you need, when you need it.  Special Instructions:  . Stay safe, wash your hands for at least 20 seconds and wear a mask when needed.  . It was good to talk with you today.    Call the [facility] office at [phone] if you have any questions, problems or concerns.

## 2020-03-04 NOTE — Telephone Encounter (Signed)
Pt calling in today to clarify medications from this am appt.  Pt is not taking lisinopril one tablet by mouth (20 mg) daily. Pt is also taking Lipitor one tablet by mouth (40 mg) daily.  Will update medication list.  Pt will call back on Wednesday with Lori's recommendations.

## 2020-03-12 MED FILL — AMLODIPINE BESYLATE 10 MG T: 10 | 30 days supply | Qty: 30 | Fill #5

## 2020-04-02 MED FILL — ?CARVEDILOL 12.5 MG TABLET: 12.5 | 30 days supply | Qty: 60 | Fill #7

## 2020-04-02 MED FILL — AMLODIPINE BESYLATE 10 MG T: 10 | 30 days supply | Qty: 30 | Fill #6

## 2020-04-02 MED FILL — ?ATORVASTATIN 40MG TABLET: 40 | 30 days supply | Qty: 30 | Fill #7

## 2020-05-14 MED FILL — ?ATORVASTATIN 40MG TABLET: 40 | 30 days supply | Qty: 30 | Fill #8

## 2020-05-14 MED FILL — CARVEDILOL 12.5 MG TABLET: 12.5 | 30 days supply | Qty: 60 | Fill #8

## 2020-05-14 MED FILL — AMLODIPINE BESYLATE 10 MG T: 10 | 30 days supply | Qty: 30 | Fill #7

## 2020-05-25 ENCOUNTER — Other Ambulatory Visit: Payer: Self-pay

## 2020-06-19 ENCOUNTER — Other Ambulatory Visit: Payer: Self-pay | Admitting: Cardiology

## 2020-06-19 ENCOUNTER — Other Ambulatory Visit: Payer: Self-pay

## 2020-06-19 MED ORDER — ATORVASTATIN CALCIUM 40 MG PO TABS
40.0000 mg | ORAL_TABLET | Freq: Every day | ORAL | 2 refills | Status: DC
  Filled 2020-06-19: qty 30, 30d supply, fill #0

## 2020-06-19 MED FILL — Carvedilol Tab 12.5 MG: ORAL | 30 days supply | Qty: 60 | Fill #0 | Status: CN

## 2020-06-19 MED FILL — Amlodipine Besylate Tab 10 MG (Base Equivalent): ORAL | 30 days supply | Qty: 30 | Fill #0 | Status: CN

## 2020-06-26 ENCOUNTER — Other Ambulatory Visit: Payer: Self-pay

## 2020-07-01 ENCOUNTER — Emergency Department (HOSPITAL_COMMUNITY): Payer: Self-pay

## 2020-07-01 ENCOUNTER — Encounter (HOSPITAL_COMMUNITY): Payer: Self-pay

## 2020-07-01 ENCOUNTER — Other Ambulatory Visit: Payer: Self-pay

## 2020-07-01 ENCOUNTER — Emergency Department (HOSPITAL_COMMUNITY)
Admission: EM | Admit: 2020-07-01 | Discharge: 2020-07-01 | Disposition: A | Discharge status: Home / Self Care | Payer: Self-pay | Attending: Emergency Medicine | Admitting: Emergency Medicine

## 2020-07-01 DIAGNOSIS — R0781 Pleurodynia: Secondary | ICD-10-CM | POA: Insufficient documentation

## 2020-07-01 DIAGNOSIS — F17210 Nicotine dependence, cigarettes, uncomplicated: Secondary | ICD-10-CM | POA: Insufficient documentation

## 2020-07-01 DIAGNOSIS — Z7982 Long term (current) use of aspirin: Secondary | ICD-10-CM | POA: Insufficient documentation

## 2020-07-01 DIAGNOSIS — I10 Essential (primary) hypertension: Secondary | ICD-10-CM | POA: Insufficient documentation

## 2020-07-01 DIAGNOSIS — Z79899 Other long term (current) drug therapy: Secondary | ICD-10-CM | POA: Insufficient documentation

## 2020-07-01 MED ORDER — AMLODIPINE BESYLATE 10 MG PO TABS
10.0000 mg | ORAL_TABLET | Freq: Every day | ORAL | 0 refills | Status: DC
  Filled 2020-07-01: qty 30, 30d supply, fill #0

## 2020-07-01 MED ORDER — CARVEDILOL 12.5 MG PO TABS
12.5000 mg | ORAL_TABLET | Freq: Two times a day (BID) | ORAL | 0 refills | Status: DC
  Filled 2020-07-01: qty 60, 30d supply, fill #0

## 2020-07-01 MED ORDER — LIDOCAINE 5 % EX PTCH
1.0000 | MEDICATED_PATCH | Freq: Once | CUTANEOUS | Status: DC
  Administered 2020-07-01: 1 via TRANSDERMAL
  Filled 2020-07-01: qty 1

## 2020-07-01 MED ORDER — KETOROLAC TROMETHAMINE 30 MG/ML IJ SOLN
15.0000 mg | Freq: Once | INTRAMUSCULAR | Status: AC
  Administered 2020-07-01: 15 mg via INTRAMUSCULAR
  Filled 2020-07-01: qty 1

## 2020-07-01 MED ORDER — NAPROXEN 500 MG PO TABS
500.0000 mg | ORAL_TABLET | Freq: Two times a day (BID) | ORAL | 0 refills | Status: DC
  Filled 2020-07-01: qty 30, 15d supply, fill #0

## 2020-07-01 NOTE — Discharge Instructions (Addendum)
Your chest x-ray did not show an obvious rib fracture, but you could still have a nondisplaced rib fracture versus injury to the chest wall.  Continue taking naproxen twice daily, you can take 1000 mg of Tylenol every 6 hours as needed in addition to this.  You can also use over-the-counter Salonpas lidocaine patches which can be applied for 12 hours at a time to help treat pain.  Use incentive spirometer every few hours to help ensure that you are taking deep breaths.  I have also sent in refills of your blood pressure medications.  Please follow-up closely with your primary care doctor for continued management of your blood pressure.  If you develop worsening chest wall pain, shortness of breath, fever, cough or other new or concerning symptoms return for reevaluation

## 2020-07-01 NOTE — ED Provider Notes (Signed)
[facility]-EMERGENCY DEPT Provider Note   CSN: [id number] Arrival date & time: 07/01/20  0732     History Chief Complaint  Patient presents with  . rib cage pain    Rodney Black is a 55 y.o. male.  Rodney Black is a 55 y.o. M with history of a-fib, hypertension, ACS and NSTEMI presents with right sided rib pain.Pain started 1 month ago and he thought he had pulled a muscle. Pt states it got worse 2 days ago when he coughed. He reports it is now much worse with movement. Pain is located over lower right ribs and does not radiate. States he used Teacher, music which provided relief from the pain, but today did not get as much relief. No chest pain elsewhere, no shortness of breath, abdominal pain, urinary sx, back pain. No other aggravating or alleviating.        Past Medical History:  Diagnosis Date  . A-fib (HCC) 03/27/2014  . Abnormal nuclear stress test   . ACS (acute coronary syndrome) (HCC) 04/20/2019  . Alcohol abuse   . Alcohol dependence (HCC) 03/27/2014  . Alcohol use 05/11/2019  . Atrial flutter with rapid ventricular response (HCC) 03/27/2014   unknown duration, newly diagnosed 03/2014  . Bipolar 1 disorder (HCC)   . Manic depression (HCC)   . Mood disorder (HCC) 04/05/2014  . Non-ST elevation (NSTEMI) myocardial infarction (HCC) 05/11/2019  . Tobacco abuse     Patient Active Problem List   Diagnosis Date Noted  . Non-ST elevation (NSTEMI) myocardial infarction (HCC) 05/11/2019  . Alcohol use 05/11/2019  . Abnormal nuclear stress test   . ACS (acute coronary syndrome) (HCC) 04/20/2019  . Essential hypertension 04/05/2014  . Mood disorder (HCC) 04/05/2014  . Atrial flutter with rapid ventricular response (HCC) 03/27/2014  . Tobacco use 03/27/2014  . Alcohol dependence (HCC) 03/27/2014  . A-fib (HCC) 03/27/2014    Past Surgical History:  Procedure Laterality Date  . arm fracture     when patient was young  . ATRIAL FLUTTER ABLATION  N/A 03/30/2014   Procedure: ATRIAL FLUTTER ABLATION;  Surgeon: [name], MD;  Location: [facility] CATH LAB;  Service: Cardiovascular;  Laterality: N/A;  . LEFT Black CATH AND CORONARY ANGIOGRAPHY N/A 04/26/2019   Procedure: LEFT Black CATH AND CORONARY ANGIOGRAPHY;  Surgeon: Lennette Bihari, MD;  Location: [facility] INVASIVE CV LAB;  Service: Cardiovascular;  Laterality: N/A;  . TEE WITHOUT CARDIOVERSION N/A 03/30/2014   Procedure: TRANSESOPHAGEAL ECHOCARDIOGRAM (TEE);  Surgeon: [name], MD;  Location: [facility] ENDOSCOPY;  Service: Cardiovascular;  Laterality: N/A;       Family History  Problem Relation Age of Onset  . Hypertension Mother   . Black attack Mother 41  . Black disease Mother   . Lung cancer Father        heavy smoker    Social History   Tobacco Use  . Smoking status: Current Every Day Smoker    Packs/day: 1.50    Types: Cigarettes  . Smokeless tobacco: Never Used  . Tobacco comment: 1.5 pack per day since high school  Vaping Use  . Vaping Use: Never used  Substance Use Topics  . Alcohol use: Yes    Alcohol/week: 0.0 standard drinks    Comment: 2x 40 oz beers daily  . Drug use: Yes    Types: Marijuana    Comment: occasional marijuana use    Home Medications Prior to Admission medications   Medication Sig Start  Date End Date Taking? Authorizing Provider  naproxen (NAPROSYN) 500 MG tablet Take 1 tablet (500 mg total) by mouth 2 (two) times daily. 07/01/20  Yes Dartha Lodge, PA-C  amLODipine (NORVASC) 10 MG tablet Take 1 tablet (10 mg total) by mouth daily. 07/01/20 07/31/20  Dartha Lodge, PA-C  aspirin EC 81 MG tablet Take 1 tablet (81 mg total) by mouth daily. 06/14/19   Rosalio Macadamia, NP  atorvastatin (LIPITOR) 40 MG tablet Take 1 tablet (40 mg total) by mouth daily at 6 PM. 06/19/20   Jake Bathe, MD  carvedilol (COREG) 12.5 MG tablet Take 1 tablet (12.5 mg total) by mouth 2 (two) times daily. 07/01/20 07/31/20  Dartha Lodge, PA-C    Allergies    Patient has no  known allergies.  Review of Systems   Review of Systems  Constitutional: Negative for chills and fever.  HENT: Negative.   Respiratory: Negative for cough and shortness of breath.   Cardiovascular: Positive for chest pain. Negative for palpitations and leg swelling.  Gastrointestinal: Negative for abdominal pain, nausea and vomiting.  Musculoskeletal: Negative for arthralgias and myalgias.  Skin: Negative for color change, rash and wound.  All other systems reviewed and are negative.   Physical Exam Updated Vital Signs BP (!) 170/111 (BP Location: Right Arm)   Pulse 81   Temp 98.4 F (36.9 C) (Oral)   Resp 18   Ht 5\' 8"  (1.727 m)   Wt 99.8 kg   SpO2 96%   BMI 33.45 kg/m   Physical Exam Vitals and nursing note reviewed.  Constitutional:      General: He is not in acute distress.    Appearance: Normal appearance. He is well-developed and normal weight. He is not ill-appearing or diaphoretic.  HENT:     Head: Normocephalic and atraumatic.     Mouth/Throat:     Mouth: Mucous membranes are moist.     Pharynx: Oropharynx is clear.  Eyes:     General:        Right eye: No discharge.        Left eye: No discharge.  Cardiovascular:     Rate and Rhythm: Normal rate and regular rhythm.     Black sounds: Normal Black sounds. No murmur heard. No friction rub. No gallop.   Pulmonary:     Effort: Pulmonary effort is normal. No respiratory distress.     Breath sounds: Normal breath sounds. No wheezing or rales.     Comments: Respirations equal and unlabored, patient able to speak in full sentences, lungs clear to auscultation bilaterally, focal tenderness that reliably reproduces pain over the right lower anterolateral ribs. No palpable deformity, crepitus or overlying skin changes, no chest wall tenderness elsewhere Chest:     Chest wall: Tenderness present.  Abdominal:     General: Bowel sounds are normal. There is no distension.     Palpations: Abdomen is soft. There is no  mass.     Tenderness: There is no abdominal tenderness. There is no guarding.  Musculoskeletal:        General: No deformity.     Cervical back: Neck supple.  Skin:    General: Skin is warm and dry.     Capillary Refill: Capillary refill takes less than 2 seconds.  Neurological:     Mental Status: He is alert.     Coordination: Coordination normal.     Comments: Speech is clear, able to follow commands Moves extremities without ataxia,  coordination intact  Psychiatric:        Mood and Affect: Mood normal.        Behavior: Behavior normal.     ED Results / Procedures / Treatments   Labs (all labs ordered are listed, but only abnormal results are displayed) Labs Reviewed - No data to display  EKG EKG Interpretation  Date/Time:  Monday Jul 01 2020 08:00:31 EDT Ventricular Rate:  86 PR Interval:  186 QRS Duration: 93 QT Interval:  353 QTC Calculation: 423 R Axis:   61 Text Interpretation: Sinus rhythm Atrial premature complex Confirmed by Kristine Royal [phone]) on 07/01/2020 9:00:54 AM   Radiology DG Ribs Unilateral W/Chest Right  Result Date: 07/01/2020 CLINICAL DATA:  Pain after coughing EXAM: RIGHT RIBS AND CHEST - 3+ VIEW COMPARISON:  April 20, 2019 FINDINGS: Frontal chest as well as oblique and cone-down rib images were obtained. There is mild left base atelectasis. Lungs otherwise are clear. Black size and pulmonary vascularity are normal. No adenopathy. No pneumothorax or pleural effusion.  No evident rib fracture. IMPRESSION: No evident rib fracture. No edema or airspace opacity. No pneumothorax. Slight left base atelectasis noted. Electronically Signed   By: Bretta Bang III M.D.   On: 07/01/2020 09:03    Procedures Procedures   Medications Ordered in ED Medications  ketorolac (TORADOL) 30 MG/ML injection 15 mg (15 mg Intramuscular Given 07/01/20 1941)    ED Course  I have reviewed the triage vital signs and the nursing notes.  Pertinent labs & imaging  results that were available during my care of the patient were reviewed by me and considered in my medical decision making (see chart for details).    MDM Rules/Calculators/A&P                          55 yo M present with right lower anterolateral rib pain, started a month ago after he thought he pulled a muscle but then coughed 2 days ago and pain worsened, concerned he broke a rib. Pain consistently reproducible with palpation. EKG with sinus rhythm without any concerning changed. CXR without obvious fracture, and no other acute abnormalities. Pain treated and resolved here in the ED. Will treat for occult rib fracture versus musculoskeletal chest wall pain. Very low suspicion for cardiac or pulmonary etiology, no abdominal tenderness.    At this time there does not appear to be any evidence of an acute emergency medical condition and the patient appears stable for discharge with appropriate outpatient follow up.Diagnosis was discussed with patient who verbalizes understanding and is agreeable to discharge.   Final Clinical Impression(s) / ED Diagnoses Final diagnoses:  Rib pain on right side    Rx / DC Orders ED Discharge Orders         Ordered    carvedilol (COREG) 12.5 MG tablet  2 times daily        07/01/20 1014    amLODipine (NORVASC) 10 MG tablet  Daily        07/01/20 1014    naproxen (NAPROSYN) 500 MG tablet  2 times daily        07/01/20 1014           Jodi Geralds [place], [place] 07/02/20 2244    Wynetta Fines, MD 07/03/20 1611

## 2020-07-01 NOTE — ED Triage Notes (Signed)
Patient reports that he has had right rib cage pain x 1 month. Patient states he coughs frequently and has been using [facility] which has helped. Patient states he coughed 2 days ago and is now having worse pain on the right side.

## 2020-07-01 NOTE — ED Notes (Signed)
Provided pt with incentive spirometer and demonstrated use. Pt able to return demonstration and verbalized understanding/importance of usage.

## 2020-07-08 ENCOUNTER — Other Ambulatory Visit: Payer: Self-pay

## 2020-07-09 ENCOUNTER — Other Ambulatory Visit: Payer: Self-pay

## 2020-07-09 ENCOUNTER — Telehealth: Payer: Self-pay | Admitting: Cardiology

## 2020-07-09 ENCOUNTER — Encounter: Payer: Self-pay | Admitting: *Deleted

## 2020-07-09 ENCOUNTER — Ambulatory Visit (INDEPENDENT_AMBULATORY_CARE_PROVIDER_SITE_OTHER): Payer: Self-pay | Admitting: Cardiology

## 2020-07-09 ENCOUNTER — Encounter: Payer: Self-pay | Admitting: Cardiology

## 2020-07-09 VITALS — BP 144/97 | HR 145 | Ht 68.0 in | Wt 232.0 lb

## 2020-07-09 DIAGNOSIS — I4892 Unspecified atrial flutter: Secondary | ICD-10-CM

## 2020-07-09 DIAGNOSIS — R7989 Other specified abnormal findings of blood chemistry: Secondary | ICD-10-CM

## 2020-07-09 DIAGNOSIS — I10 Essential (primary) hypertension: Secondary | ICD-10-CM

## 2020-07-09 DIAGNOSIS — Z720 Tobacco use: Secondary | ICD-10-CM

## 2020-07-09 DIAGNOSIS — E782 Mixed hyperlipidemia: Secondary | ICD-10-CM

## 2020-07-09 DIAGNOSIS — I2510 Atherosclerotic heart disease of native coronary artery without angina pectoris: Secondary | ICD-10-CM

## 2020-07-09 DIAGNOSIS — Z01812 Encounter for preprocedural laboratory examination: Secondary | ICD-10-CM

## 2020-07-09 LAB — CBC
Hematocrit: 52.6 % — ABNORMAL HIGH (ref 37.5–51.0)
Hemoglobin: 18.4 g/dL — ABNORMAL HIGH (ref 13.0–17.7)
MCH: 34.2 pg — ABNORMAL HIGH (ref 26.6–33.0)
MCHC: 35 g/dL (ref 31.5–35.7)
MCV: 98 fL — ABNORMAL HIGH (ref 79–97)
Platelets: 271 10*3/uL (ref 150–450)
RBC: 5.38 x10E6/uL (ref 4.14–5.80)
RDW: 12.3 % (ref 11.6–15.4)
WBC: 7.7 10*3/uL (ref 3.4–10.8)

## 2020-07-09 LAB — COMPREHENSIVE METABOLIC PANEL
ALT: 80 IU/L — ABNORMAL HIGH (ref 0–44)
AST: 33 IU/L (ref 0–40)
Albumin/Globulin Ratio: 1.6 (ref 1.2–2.2)
Albumin: 4.3 g/dL (ref 3.8–4.9)
Alkaline Phosphatase: 99 IU/L (ref 44–121)
BUN/Creatinine Ratio: 10 (ref 9–20)
BUN: 10 mg/dL (ref 6–24)
Bilirubin Total: 0.6 mg/dL (ref 0.0–1.2)
CO2: 18 mmol/L — ABNORMAL LOW (ref 20–29)
Calcium: 9.8 mg/dL (ref 8.7–10.2)
Chloride: 103 mmol/L (ref 96–106)
Creatinine, Ser: 1.05 mg/dL (ref 0.76–1.27)
Globulin, Total: 2.7 g/dL (ref 1.5–4.5)
Glucose: 123 mg/dL — ABNORMAL HIGH (ref 65–99)
Potassium: 4.7 mmol/L (ref 3.5–5.2)
Sodium: 139 mmol/L (ref 134–144)
Total Protein: 7 g/dL (ref 6.0–8.5)
eGFR: 84 mL/min/{1.73_m2} (ref >59)

## 2020-07-09 MED ORDER — APIXABAN 5 MG PO TABS
5.0000 mg | ORAL_TABLET | Freq: Two times a day (BID) | ORAL | 11 refills | Status: AC
  Filled 2020-07-09: qty 60, 30d supply, fill #0
  Filled 2020-08-13: qty 60, 30d supply, fill #1
  Filled 2020-09-16: qty 60, 30d supply, fill #2
  Filled 2020-10-18: qty 60, 30d supply, fill #3
  Filled 2020-11-22: qty 60, 30d supply, fill #4

## 2020-07-09 MED ORDER — ATORVASTATIN CALCIUM 40 MG PO TABS
40.0000 mg | ORAL_TABLET | Freq: Every day | ORAL | 3 refills | Status: AC
  Filled 2020-07-09: qty 30, 30d supply, fill #0
  Filled 2020-08-13: qty 30, 30d supply, fill #1
  Filled 2020-09-16: qty 30, 30d supply, fill #2
  Filled 2020-10-18: qty 30, 30d supply, fill #3
  Filled 2020-11-22: qty 30, 30d supply, fill #4

## 2020-07-09 MED ORDER — CARVEDILOL 12.5 MG PO TABS
12.5000 mg | ORAL_TABLET | Freq: Two times a day (BID) | ORAL | 3 refills | Status: AC
  Filled 2020-07-09: qty 60, 30d supply, fill #0
  Filled 2020-08-13: qty 60, 30d supply, fill #1
  Filled 2020-09-16: qty 60, 30d supply, fill #2
  Filled 2020-10-18: qty 60, 30d supply, fill #3
  Filled 2020-11-22: qty 60, 30d supply, fill #4

## 2020-07-09 MED ORDER — AMLODIPINE BESYLATE 10 MG PO TABS
10.0000 mg | ORAL_TABLET | Freq: Every day | ORAL | 3 refills | Status: AC
  Filled 2020-07-09: qty 30, 30d supply, fill #0
  Filled 2020-08-13: qty 30, 30d supply, fill #1
  Filled 2020-09-16: qty 30, 30d supply, fill #2
  Filled 2020-10-18: qty 30, 30d supply, fill #3
  Filled 2020-11-22: qty 30, 30d supply, fill #4

## 2020-07-09 NOTE — Telephone Encounter (Signed)
Sent cardioversion instructions to pt through MyChart.  Called to review with him but had to leave a message.  Requested he call back with any questions/concerns or if he is unable to access his MyChart.

## 2020-07-09 NOTE — Telephone Encounter (Signed)
Patient is requesting to schedule a TEE.

## 2020-07-09 NOTE — Progress Notes (Signed)
Cardiology Office Note:    Date:  07/09/2020   ID:  Rodney Black, DOB 24-Oct-1965, MRN [id number]  PCP:  Aviva Kluver   [facility] Providers Cardiologist:  Donato Schultz, MD     Referring MD: Laruth Bouchard, MD     History of Present Illness:    Rodney Black is a 55 y.o. male here for the follow-up of atrial flutter with prior ablation, alcohol tobacco use bipolar disorder prior admission in February 2021 with non-ST elevation myocardial infarction.  Cardiac catheterization showed mid LAD nonobstructive stenosis.  Medical management.  Off meds for 3 weeks.  In atrial flutter currently 145.  Still drinking alcohol, still smoking.  States that he cannot put this down.  He works 2 days 1 week, 3 days the next.  5 hours a day.  Harder on his legs he states on the concrete floors.  Feeling some shortness of breath.  No chest pain.  Rodney Black had been seeing him.  Prior visit reviewed.  Past Medical History:  Diagnosis Date  . A-fib (HCC) 03/27/2014  . Abnormal nuclear stress test   . ACS (acute coronary syndrome) (HCC) 04/20/2019  . Alcohol abuse   . Alcohol dependence (HCC) 03/27/2014  . Alcohol use 05/11/2019  . Atrial flutter with rapid ventricular response (HCC) 03/27/2014   unknown duration, newly diagnosed 03/2014  . Bipolar 1 disorder (HCC)   . Manic depression (HCC)   . Mood disorder (HCC) 04/05/2014  . Non-ST elevation (NSTEMI) myocardial infarction (HCC) 05/11/2019  . Tobacco abuse     Past Surgical History:  Procedure Laterality Date  . arm fracture     when patient was young  . ATRIAL FLUTTER ABLATION N/A 03/30/2014   Procedure: ATRIAL FLUTTER ABLATION;  Surgeon: [name], MD;  Location: [facility] CATH LAB;  Service: Cardiovascular;  Laterality: N/A;  . LEFT HEART CATH AND CORONARY ANGIOGRAPHY N/A 04/26/2019   Procedure: LEFT HEART CATH AND CORONARY ANGIOGRAPHY;  Surgeon: Lennette Bihari, MD;  Location: [facility] INVASIVE CV LAB;  Service: Cardiovascular;  Laterality: N/A;  . TEE  WITHOUT CARDIOVERSION N/A 03/30/2014   Procedure: TRANSESOPHAGEAL ECHOCARDIOGRAM (TEE);  Surgeon: [name], MD;  Location: [facility] ENDOSCOPY;  Service: Cardiovascular;  Laterality: N/A;    Current Medications: Current Meds  Medication Sig  . apixaban (ELIQUIS) 5 MG TABS tablet Take 1 tablet (5 mg total) by mouth 2 (two) times daily.  . [DISCONTINUED] amLODipine (NORVASC) 10 MG tablet Take 1 tablet (10 mg total) by mouth daily.  . [DISCONTINUED] aspirin EC 81 MG tablet Take 1 tablet (81 mg total) by mouth daily.  . [DISCONTINUED] atorvastatin (LIPITOR) 40 MG tablet Take 1 tablet (40 mg total) by mouth daily at 6 PM.  . [DISCONTINUED] carvedilol (COREG) 12.5 MG tablet Take 1 tablet (12.5 mg total) by mouth 2 (two) times daily.  . [DISCONTINUED] naproxen (NAPROSYN) 500 MG tablet Take 1 tablet (500 mg total) by mouth 2 (two) times daily.     Allergies:   Patient has no known allergies.   Social History   Socioeconomic History  . Marital status: Single    Spouse name: Not on file  . Number of children: Not on file  . Years of education: Not on file  . Highest education level: Not on file  Occupational History  . Occupation: janitor  Tobacco Use  . Smoking status: Current Every Day Smoker    Packs/day: 1.50    Types: Cigarettes  . Smokeless tobacco: Never Used  .  Tobacco comment: 1.5 pack per day since high school  Vaping Use  . Vaping Use: Never used  Substance and Sexual Activity  . Alcohol use: Yes    Alcohol/week: 0.0 standard drinks    Comment: 2x 40 oz beers daily  . Drug use: Yes    Types: Marijuana    Comment: occasional marijuana use  . Sexual activity: Not on file  Other Topics Concern  . Not on file  Social History Narrative  . Not on file   Social Determinants of Health   Financial Resource Strain: Not on file  Food Insecurity: Not on file  Transportation Needs: Not on file  Physical Activity: Not on file  Stress: Not on file  Social Connections: Not on  file     Family History: The patient's family history includes Heart attack (age of onset: 76) in his mother; Heart disease in his mother; Hypertension in his mother; Lung cancer in his father.  ROS:   Please see the history of present illness.    No syncope, no bleeding, no orthopnea all other systems reviewed and are negative.  EKGs/Labs/Other Studies Reviewed:    The following studies were reviewed today:  Cardiac catheterization from 04/26/2019 personally reviewed-35% stenosis mid LAD.  Echo 2/21 - EF 65% grade 1 diastolic dysfunction.  EKG:  EKG is  ordered today.  The ekg ordered today demonstrates atrial flutter 2-1 conduction heart rate 145 bpm.  Recent Labs: 03/04/2020: ALT 68; BUN 10; Creatinine, Ser 0.99; Hemoglobin 17.5; Platelets 292; Potassium 4.5; Sodium 138  Recent Lipid Panel    Component Value Date/Time   CHOL 138 03/04/2020 0926   TRIG 104 03/04/2020 0926   HDL 50 03/04/2020 0926   CHOLHDL 2.8 03/04/2020 0926   CHOLHDL 5.2 04/21/2019 0806   VLDL 26 04/21/2019 0806   LDLCALC 69 03/04/2020 0926     Risk Assessment/Calculations:    CHA2DS2-VASc Score = 3  This indicates a 3.2% annual risk of stroke. The patient's score is based upon: CHF History: Yes HTN History: Yes Diabetes History: No Stroke History: No Vascular Disease History: Yes Age Score: 0 Gender Score: 0      Physical Exam:    VS:  BP (!) 144/97 (BP Location: Left Arm, Patient Position: Sitting, Cuff Size: Large)   Pulse (!) 145   Ht 5\' 8"  (1.727 m)   Wt 232 lb (105.2 kg)   SpO2 97%   BMI 35.28 kg/m     Wt Readings from Last 3 Encounters:  07/09/20 232 lb (105.2 kg)  07/01/20 220 lb (99.8 kg)  03/04/20 227 lb (103 kg)     GEN:  Well nourished, well developed in no acute distress HEENT: Normal NECK: No JVD; No carotid bruits LYMPHATICS: No lymphadenopathy CARDIAC: Tachycardic regular, no murmurs, rubs, gallops RESPIRATORY:  Clear to auscultation without rales, wheezing or  rhonchi  ABDOMEN: Soft, non-tender, non-distended MUSCULOSKELETAL:  No edema; No deformity  SKIN: Warm and dry NEUROLOGIC:  Alert and oriented x 3 PSYCHIATRIC:  Normal affect   ASSESSMENT:    1. Coronary artery disease involving native coronary artery of native heart without angina pectoris   2. Elevated LFTs   3. Mixed hyperlipidemia   4. Tobacco use   5. Essential hypertension   6. Pre-procedure lab exam   7. Atrial flutter with rapid ventricular response (HCC)    PLAN:    In order of problems listed above:  Coronary artery disease with prior non-ST elevation myocardial infarction - Cardiac catheterization  as above mild nonobstructive LAD disease following a false positive nuclear stress test in the hospital setting.  Managed medically.  Continue with goal-directed medical therapy  Atrial flutter - EKG today shows atrial flutter heart rate 145 bpm.  Concomitant shortness of breath.  We will go ahead and get him set up for a TEE cardioversion.  We will start him on Eliquis 5 mg twice a day.  Continue to encourage compliance.  Risks and benefits of procedures have been discussed.  Willing to proceed.  With his shortness of breath, has component of acute diastolic heart failure secondary to rapid atrial flutter.  Prior echocardiogram reviewed as above.  Hyperlipidemia - Continue with high intensity statin therapy with no myalgias.  On atorvastatin 40 mg once a day.  Continue with current prescription drug management.  Prior LDL 69, at goal in January 2022.  Creatinine 0.99 hemoglobin 17.5.  Elevated LFTs - Alcohol use, most recently ALT 68 AST 30.  We will go ahead and stop his low-dose aspirin given concomitant Eliquis.  Essential hypertension - Continue to encourage medication use as above.  Tobacco use - Continue to encourage cessation.  Bipolar depression - Continue to encourage PCP utilization  If symptoms worsen or become more worrisome, hospitalization may be  warranted. Checking labs today, check a complete metabolic profile and a CBC.    Shared Decision Making/Informed Consent The risks [stroke, cardiac arrhythmias rarely resulting in the need for a temporary or permanent pacemaker, skin irritation or burns, esophageal damage, perforation (1:10,000 risk), bleeding, pharyngeal hematoma as well as other potential complications associated with conscious sedation including aspiration, arrhythmia, respiratory failure and death], benefits (treatment guidance, restoration of normal sinus rhythm, diagnostic support) and alternatives of a transesophageal echocardiogram guided cardioversion were discussed in detail with Rodney Black and he is willing to proceed.  Time spent: 41 minutes in review of prior hospitalizations, medical records, echocardiogram, prior cardiac catheterization, stress test, lab work, discussion of atrial flutter, cardioversion.     Medication Adjustments/Labs and Tests Ordered: Current medicines are reviewed at length with the patient today.  Concerns regarding medicines are outlined above.  Orders Placed This Encounter  Procedures  . CBC  . Comprehensive metabolic panel  . EKG 12-Lead   Meds ordered this encounter  Medications  . apixaban (ELIQUIS) 5 MG TABS tablet    Sig: Take 1 tablet (5 mg total) by mouth 2 (two) times daily.    Dispense:  60 tablet    Refill:  11    Patient Instructions  Medication Instructions:  Please discontinue your Aspirin. Start Eliquis 5 mg one tablet twice a day. Continue all other medications as listed.  *If you need a refill on your cardiac medications before your next appointment, please call your pharmacy*  Lab Work: Please have blood work today (CBC, CMP)  If you have labs (blood work) drawn today and your tests are completely normal, you will receive your results only by: Marland Kitchen MyChart Message (if you have MyChart) OR . A paper copy in the mail If you have any lab test that is abnormal  or we need to change your treatment, we will call you to review the results.  Testing/Procedures: Your physician has requested that you have a TEE/Cardioversion. During a TEE, sound waves are used to create images of your heart. It provides your doctor with information about the size and shape of your heart and how well your heart's chambers and valves are working. In this test, a transducer is  attached to the end of a flexible tube that is guided down you throat and into your esophagus (the tube leading from your mouth to your stomach) to get a more detailed image of your heart. Once the TEE has determined that a blood clot is not present, the cardioversion begins. Electrical Cardioversion uses a jolt of electricity to your heart either through paddles or wired patches attached to your chest. This is a controlled, usually prescheduled, procedure. This procedure is done at the hospital and you are not awake during the procedure. You usually go home the day of the procedure. Please see the instruction sheet given to you today for more information.  Follow-Up: At [facility], you and your health needs are our priority.  As part of our continuing mission to provide you with exceptional heart care, we have created designated Provider Care Teams.  These Care Teams include your primary Cardiologist (physician) and Advanced Practice Providers (APPs -  Physician Assistants and Nurse Practitioners) who all work together to provide you with the care you need, when you need it.  We recommend signing up for the patient portal called "MyChart".  Sign up information is provided on this After Visit Summary.  MyChart is used to connect with patients for Virtual Visits (Telemedicine).  Patients are able to view lab/test results, encounter notes, upcoming appointments, etc.  Non-urgent messages can be sent to your provider as well.   To learn more about what you can do with MyChart, go to [email or web address].     Your next appointment:   1 month(s)  The format for your next appointment:   In Person  Provider:   You will follow up in the Atrial Fibrillation Clinic located at [facility]. Your provider will be: Rudi Coco, NP or Alphonzo Severance.  Thank you for choosing [facility]!!        Signed, Donato Schultz, MD  07/09/2020 10:06 AM    [facility]

## 2020-07-09 NOTE — Telephone Encounter (Signed)
Pt has been scheduled for outpt TEE guided cardioversion for Monday Jul 15, 2020 with Dr Jens Som at 9 AM. (case # [id number])  Pt is to report to the Main Entrance A at 8 AM for check in.  NPO p midnight except medication with sips of water. Must have someone with him to drive him home and stay for the 1st 24 hrs.  Bring recent medication list and insurance card.   Please do not miss any doses of Eliquis!

## 2020-07-09 NOTE — Addendum Note (Signed)
Addended by: Donato Schultz C on: 07/09/2020 05:15 PM   Modules accepted: Orders, SmartSet

## 2020-07-09 NOTE — Patient Instructions (Signed)
Medication Instructions:  Please discontinue your Aspirin. Start Eliquis 5 mg one tablet twice a day. Continue all other medications as listed.  *If you need a refill on your cardiac medications before your next appointment, please call your pharmacy*  Lab Work: Please have blood work today (CBC, CMP)  If you have labs (blood work) drawn today and your tests are completely normal, you will receive your results only by: Marland Kitchen MyChart Message (if you have MyChart) OR . A paper copy in the mail If you have any lab test that is abnormal or we need to change your treatment, we will call you to review the results.  Testing/Procedures: Your physician has requested that you have a TEE/Cardioversion. During a TEE, sound waves are used to create images of your heart. It provides your doctor with information about the size and shape of your heart and how well your heart's chambers and valves are working. In this test, a transducer is attached to the end of a flexible tube that is guided down you throat and into your esophagus (the tube leading from your mouth to your stomach) to get a more detailed image of your heart. Once the TEE has determined that a blood clot is not present, the cardioversion begins. Electrical Cardioversion uses a jolt of electricity to your heart either through paddles or wired patches attached to your chest. This is a controlled, usually prescheduled, procedure. This procedure is done at the hospital and you are not awake during the procedure. You usually go home the day of the procedure. Please see the instruction sheet given to you today for more information.  Follow-Up: At [facility], you and your health needs are our priority.  As part of our continuing mission to provide you with exceptional heart care, we have created designated Provider Care Teams.  These Care Teams include your primary Cardiologist (physician) and Advanced Practice Providers (APPs -  Physician Assistants and  Nurse Practitioners) who all work together to provide you with the care you need, when you need it.  We recommend signing up for the patient portal called "MyChart".  Sign up information is provided on this After Visit Summary.  MyChart is used to connect with patients for Virtual Visits (Telemedicine).  Patients are able to view lab/test results, encounter notes, upcoming appointments, etc.  Non-urgent messages can be sent to your provider as well.   To learn more about what you can do with MyChart, go to [email or web address].    Your next appointment:   1 month(s)  The format for your next appointment:   In Person  Provider:   You will follow up in the Atrial Fibrillation Clinic located at [facility]. Your provider will be: Rudi Coco, NP or Alphonzo Severance.  Thank you for choosing [facility]!!

## 2020-07-10 NOTE — Telephone Encounter (Signed)
Left message for pt to c/b with any questions regarding TEE/cardioversion scheduled for Monday 5/23.

## 2020-07-12 ENCOUNTER — Encounter (HOSPITAL_COMMUNITY): Payer: Self-pay | Admitting: Certified Registered Nurse Anesthetist

## 2020-07-15 ENCOUNTER — Encounter (HOSPITAL_COMMUNITY): Payer: Self-pay | Admitting: Cardiology

## 2020-07-15 ENCOUNTER — Encounter (HOSPITAL_COMMUNITY)
Admission: RE | Disposition: A | Discharge status: Home / Self Care | Payer: Self-pay | Source: Ambulatory Visit | Attending: Cardiology

## 2020-07-15 ENCOUNTER — Ambulatory Visit (HOSPITAL_COMMUNITY)
Admission: RE | Admit: 2020-07-15 | Discharge: 2020-07-15 | Disposition: A | Discharge status: Home / Self Care | Payer: Medicaid Other | Source: Ambulatory Visit | Attending: Cardiology | Admitting: Cardiology

## 2020-07-15 ENCOUNTER — Other Ambulatory Visit (HOSPITAL_COMMUNITY): Payer: Medicaid Other

## 2020-07-15 DIAGNOSIS — R748 Abnormal levels of other serum enzymes: Secondary | ICD-10-CM | POA: Insufficient documentation

## 2020-07-15 DIAGNOSIS — F17210 Nicotine dependence, cigarettes, uncomplicated: Secondary | ICD-10-CM | POA: Insufficient documentation

## 2020-07-15 DIAGNOSIS — I110 Hypertensive heart disease with heart failure: Secondary | ICD-10-CM | POA: Insufficient documentation

## 2020-07-15 DIAGNOSIS — I5031 Acute diastolic (congestive) heart failure: Secondary | ICD-10-CM | POA: Insufficient documentation

## 2020-07-15 DIAGNOSIS — Z7901 Long term (current) use of anticoagulants: Secondary | ICD-10-CM | POA: Insufficient documentation

## 2020-07-15 DIAGNOSIS — F3130 Bipolar disorder, current episode depressed, mild or moderate severity, unspecified: Secondary | ICD-10-CM | POA: Insufficient documentation

## 2020-07-15 DIAGNOSIS — I4892 Unspecified atrial flutter: Secondary | ICD-10-CM | POA: Insufficient documentation

## 2020-07-15 DIAGNOSIS — E782 Mixed hyperlipidemia: Secondary | ICD-10-CM | POA: Insufficient documentation

## 2020-07-15 DIAGNOSIS — I2510 Atherosclerotic heart disease of native coronary artery without angina pectoris: Secondary | ICD-10-CM | POA: Insufficient documentation

## 2020-07-15 DIAGNOSIS — Z538 Procedure and treatment not carried out for other reasons: Secondary | ICD-10-CM | POA: Insufficient documentation

## 2020-07-15 SURGERY — CANCELLED PROCEDURE

## 2020-07-15 NOTE — H&P (Signed)
Jake Bathe, MD  Physician  Cardiology  Progress Notes    Signed  Encounter Date:  07/09/2020      Related encounter: Office Visit from 07/09/2020 in [facility] Office       Signed      Expand All Collapse All    Show:Clear all [x] Manual[x] Template[] Copied  Added by: [x] , MD   [] Hover for details   Cardiology Office Note:    Date:  07/09/2020   ID:  Rodney Black, DOB 1965-12-11, MRN 07/11/2020  PCP:  [name] No              [facility] Providers Cardiologist:  14/06/1965, MD     Referring MD: [id number], MD     History of Present Illness:    Rodney Black is a 55 y.o. male here for the follow-up of atrial flutter with prior ablation, alcohol tobacco use bipolar disorder prior admission in February 2021 with non-ST elevation myocardial infarction.  Cardiac catheterization showed mid LAD nonobstructive stenosis.  Medical management.  Off meds for 3 weeks.  In atrial flutter currently 145.  Still drinking alcohol, still smoking.  States that he cannot put this down.  He works 2 days 1 week, 3 days the next.  5 hours a day.  Harder on his legs he states on the concrete floors.  Feeling some shortness of breath.  No chest pain.  [name] had been seeing him.  Prior visit reviewed.      Past Medical History:  Diagnosis Date  . A-fib (HCC) 03/27/2014  . Abnormal nuclear stress test   . ACS (acute coronary syndrome) (HCC) 04/20/2019  . Alcohol abuse   . Alcohol dependence (HCC) 03/27/2014  . Alcohol use 05/11/2019  . Atrial flutter with rapid ventricular response (HCC) 03/27/2014   unknown duration, newly diagnosed 03/2014  . Bipolar 1 disorder (HCC)   . Manic depression (HCC)   . Mood disorder (HCC) 04/05/2014  . Non-ST elevation (NSTEMI) myocardial infarction (HCC) 05/11/2019  . Tobacco abuse          Past Surgical History:  Procedure Laterality Date  . arm fracture     when patient was young   . ATRIAL FLUTTER ABLATION N/A 03/30/2014   Procedure: ATRIAL FLUTTER ABLATION;  Surgeon: 06/04/2014, MD;  Location: [facility] CATH LAB;  Service: Cardiovascular;  Laterality: N/A;  . LEFT HEART CATH AND CORONARY ANGIOGRAPHY N/A 04/26/2019   Procedure: LEFT HEART CATH AND CORONARY ANGIOGRAPHY;  Surgeon: [name], MD;  Location: [facility] INVASIVE CV LAB;  Service: Cardiovascular;  Laterality: N/A;  . TEE WITHOUT CARDIOVERSION N/A 03/30/2014   Procedure: TRANSESOPHAGEAL ECHOCARDIOGRAM (TEE);  Surgeon: 06/26/2019, MD;  Location: [facility] ENDOSCOPY;  Service: Cardiovascular;  Laterality: N/A;    Current Medications: Active Medications      Current Meds  Medication Sig  . apixaban (ELIQUIS) 5 MG TABS tablet Take 1 tablet (5 mg total) by mouth 2 (two) times daily.  . [DISCONTINUED] amLODipine (NORVASC) 10 MG tablet Take 1 tablet (10 mg total) by mouth daily.  . [DISCONTINUED] aspirin EC 81 MG tablet Take 1 tablet (81 mg total) by mouth daily.  . [DISCONTINUED] atorvastatin (LIPITOR) 40 MG tablet Take 1 tablet (40 mg total) by mouth daily at 6 PM.  . [DISCONTINUED] carvedilol (COREG) 12.5 MG tablet Take 1 tablet (12.5 mg total) by mouth 2 (two) times daily.  . [DISCONTINUED] naproxen (NAPROSYN) 500 MG tablet Take 1 tablet (  500 mg total) by mouth 2 (two) times daily.       Allergies:   Patient has no known allergies.   Social History        Socioeconomic History  . Marital status: Single    Spouse name: Not on file  . Number of children: Not on file  . Years of education: Not on file  . Highest education level: Not on file  Occupational History  . Occupation: janitor  Tobacco Use  . Smoking status: Current Every Day Smoker    Packs/day: 1.50    Types: Cigarettes  . Smokeless tobacco: Never Used  . Tobacco comment: 1.5 pack per day since high school  Vaping Use  . Vaping Use: Never used  Substance and Sexual Activity  . Alcohol use: Yes    Alcohol/week: 0.0 standard drinks     Comment: 2x 40 oz beers daily  . Drug use: Yes    Types: Marijuana    Comment: occasional marijuana use  . Sexual activity: Not on file  Other Topics Concern  . Not on file  Social History Narrative  . Not on file   Social Determinants of Health   Financial Resource Strain: Not on file  Food Insecurity: Not on file  Transportation Needs: Not on file  Physical Activity: Not on file  Stress: Not on file  Social Connections: Not on file     Family History: The patient's family history includes Heart attack (age of onset: 32) in his mother; Heart disease in his mother; Hypertension in his mother; Lung cancer in his father.  ROS:   Please see the history of present illness.    No syncope, no bleeding, no orthopnea all other systems reviewed and are negative.  EKGs/Labs/Other Studies Reviewed:    The following studies were reviewed today:  Cardiac catheterization from 04/26/2019 personally reviewed-35% stenosis mid LAD.  Echo 2/21 - EF 65% grade 1 diastolic dysfunction.  EKG:  EKG is  ordered today.  The ekg ordered today demonstrates atrial flutter 2-1 conduction heart rate 145 bpm.  Recent Labs: 03/04/2020: ALT 68; BUN 10; Creatinine, Ser 0.99; Hemoglobin 17.5; Platelets 292; Potassium 4.5; Sodium 138  Recent Lipid Panel Labs (Brief)          Component Value Date/Time   CHOL 138 03/04/2020 0926   TRIG 104 03/04/2020 0926   HDL 50 03/04/2020 0926   CHOLHDL 2.8 03/04/2020 0926   CHOLHDL 5.2 04/21/2019 0806   VLDL 26 04/21/2019 0806   LDLCALC 69 03/04/2020 0926       Risk Assessment/Calculations:    CHA2DS2-VASc Score = 3  This indicates a 3.2% annual risk of stroke. The patient's score is based upon: CHF History: Yes HTN History: Yes Diabetes History: No Stroke History: No Vascular Disease History: Yes Age Score: 0 Gender Score: 0     Physical Exam:    VS:  BP (!) 144/97 (BP Location: Left Arm, Patient Position:  Sitting, Cuff Size: Large)   Pulse (!) 145   Ht 5\' 8"  (1.727 m)   Wt 232 lb (105.2 kg)   SpO2 97%   BMI 35.28 kg/m        Wt Readings from Last 3 Encounters:  07/09/20 232 lb (105.2 kg)  07/01/20 220 lb (99.8 kg)  03/04/20 227 lb (103 kg)     GEN:  Well nourished, well developed in no acute distress HEENT: Normal NECK: No JVD; No carotid bruits LYMPHATICS: No lymphadenopathy CARDIAC: Tachycardic regular, no murmurs,  rubs, gallops RESPIRATORY:  Clear to auscultation without rales, wheezing or rhonchi  ABDOMEN: Soft, non-tender, non-distended MUSCULOSKELETAL:  No edema; No deformity  SKIN: Warm and dry NEUROLOGIC:  Alert and oriented x 3 PSYCHIATRIC:  Normal affect   ASSESSMENT:    1. Coronary artery disease involving native coronary artery of native heart without angina pectoris   2. Elevated LFTs   3. Mixed hyperlipidemia   4. Tobacco use   5. Essential hypertension   6. Pre-procedure lab exam   7. Atrial flutter with rapid ventricular response (HCC)    PLAN:    In order of problems listed above:  Coronary artery disease with prior non-ST elevation myocardial infarction - Cardiac catheterization as above mild nonobstructive LAD disease following a false positive nuclear stress test in the hospital setting.  Managed medically.  Continue with goal-directed medical therapy  Atrial flutter - EKG today shows atrial flutter heart rate 145 bpm.  Concomitant shortness of breath.  We will go ahead and get him set up for a TEE cardioversion.  We will start him on Eliquis 5 mg twice a day.  Continue to encourage compliance.  Risks and benefits of procedures have been discussed.  Willing to proceed.  With his shortness of breath, has component of acute diastolic heart failure secondary to rapid atrial flutter.  Prior echocardiogram reviewed as above.  Hyperlipidemia - Continue with high intensity statin therapy with no myalgias.  On atorvastatin 40 mg once a day.   Continue with current prescription drug management.  Prior LDL 69, at goal in January 2022.  Creatinine 0.99 hemoglobin 17.5.  Elevated LFTs - Alcohol use, most recently ALT 68 AST 30.  We will go ahead and stop his low-dose aspirin given concomitant Eliquis.  Essential hypertension - Continue to encourage medication use as above.  Tobacco use - Continue to encourage cessation.  Bipolar depression - Continue to encourage PCP utilization  If symptoms worsen or become more worrisome, hospitalization may be warranted. Checking labs today, check a complete metabolic profile and a CBC.    Shared Decision Making/Informed Consent The risks [stroke, cardiac arrhythmias rarely resulting in the need for a temporary or permanent pacemaker, skin irritation or burns, esophageal damage, perforation (1:10,000 risk), bleeding, pharyngeal hematoma as well as other potential complications associated with conscious sedation including aspiration, arrhythmia, respiratory failure and death], benefits (treatment guidance, restoration of normal sinus rhythm, diagnostic support) and alternatives of a transesophageal echocardiogram guided cardioversion were discussed in detail with Mr. Chachere and he is willing to proceed.  Time spent: 41 minutes in review of prior hospitalizations, medical records, echocardiogram, prior cardiac catheterization, stress test, lab work, discussion of atrial flutter, cardioversion.     Medication Adjustments/Labs and Tests Ordered: Current medicines are reviewed at length with the patient today.  Concerns regarding medicines are outlined above.     Orders Placed This Encounter  Procedures  . CBC  . Comprehensive metabolic panel  . EKG 12-Lead       Meds ordered this encounter  Medications  . apixaban (ELIQUIS) 5 MG TABS tablet    Sig: Take 1 tablet (5 mg total) by mouth 2 (two) times daily.    Dispense:  60 tablet    Refill:  11    Patient  Instructions  Medication Instructions:  Please discontinue your Aspirin. Start Eliquis 5 mg one tablet twice a day. Continue all other medications as listed.  *If you need a refill on your cardiac medications before your next appointment, please  call your pharmacy*  Lab Work: Please have blood work today (CBC, CMP)  If you have labs (blood work) drawn today and your tests are completely normal, you will receive your results only by:  MyChart Message (if you have MyChart) OR  A paper copy in the mail If you have any lab test that is abnormal or we need to change your treatment, we will call you to review the results.  Testing/Procedures: Your physician has requested that you have a TEE/Cardioversion. During a TEE, sound waves are used to create images of your heart. It provides your doctor with information about the size and shape of your heart and how well your heart's chambers and valves are working. In this test, a transducer is attached to the end of a flexible tube that is guided down you throat and into your esophagus (the tube leading from your mouth to your stomach) to get a more detailed image of your heart. Once the TEE has determined that a blood clot is not present, the cardioversion begins. Electrical Cardioversion uses a jolt of electricity to your heart either through paddles or wired patches attached to your chest. This is a controlled, usually prescheduled, procedure. This procedure is done at the hospital and you are not awake during the procedure. You usually go home the day of the procedure. Please see the instruction sheet given to you today for more information.  Follow-Up: At [facility], you and your health needs are our priority. As part of our continuing mission to provide you with exceptional heart care, we have created designated Provider Care Teams. These Care Teams include your primary Cardiologist (physician) and Advanced Practice Providers (APPs -   Physician Assistants and Nurse Practitioners) who all work together to provide you with the care you need, when you need it.  We recommend signing up for the patient portal called "MyChart".  Sign up information is provided on this After Visit Summary.  MyChart is used to connect with patients for Virtual Visits (Telemedicine).  Patients are able to view lab/test results, encounter notes, upcoming appointments, etc.  Non-urgent messages can be sent to your provider as well.   To learn more about what you can do with MyChart, go to [email or web address].    Your next appointment:   1 month(s)  The format for your next appointment:   In Person  Provider:   You will follow up in the Atrial Fibrillation Clinic located at [facility]. Your provider will be: Rudi Coco, NP or Alphonzo Severance.  Thank you for choosing [facility]!!        Signed, Donato Schultz, MD  07/09/2020 10:06 AM    [facility]         For TEE/DCCV; on apixaban; no changes. Olga Millers

## 2020-07-15 NOTE — Interval H&P Note (Signed)
History and Physical Interval Note:  07/15/2020 8:00 AM  Rodney Black  has presented today for surgery, with the diagnosis of A-FLUTTER.  The various methods of treatment have been discussed with the patient and family. After consideration of risks, benefits and other options for treatment, the patient has consented to  Procedure(s): TRANSESOPHAGEAL ECHOCARDIOGRAM (TEE) (N/A) CARDIOVERSION (N/A) as a surgical intervention.  The patient's history has been reviewed, patient examined, no change in status, stable for surgery.  I have reviewed the patient's chart and labs.  Questions were answered to the patient's satisfaction.     Olga Millers

## 2020-07-15 NOTE — Progress Notes (Signed)
    Transesophageal Echocardiogram Note  SKYLOR SCHNAPP [id number] 14-Jul-1965  Procedure: Transesophageal Echocardiogram Indications: Atrial flutter  Procedure Details Consent: Obtained  On arrival pt in sinus; procedure canceled; continue present medications including apixaban.  Olga Millers, MD

## 2020-08-13 ENCOUNTER — Other Ambulatory Visit: Payer: Self-pay

## 2020-08-14 ENCOUNTER — Other Ambulatory Visit: Payer: Self-pay

## 2020-08-19 ENCOUNTER — Telehealth: Payer: Self-pay | Admitting: Cardiology

## 2020-08-19 NOTE — Telephone Encounter (Signed)
Patient c/o Palpitations:  High priority if patient c/o lightheadedness, shortness of breath, or chest pain  How long have you had palpitations/irregular HR/ Afib? Are you having the symptoms now? Not right this minute. Came from no where at work yesterday .   Are you currently experiencing lightheadedness, SOB or CP? Lightheadedness, SOB and sweating   Do you have a history of afib (atrial fibrillation) or irregular heart rhythm? 3 years ago it started   Have you checked your BP or HR? (document readings if available): no    Are you experiencing any other symptoms?

## 2020-08-19 NOTE — Progress Notes (Signed)
Primary Care Physician: Pcp, No Primary Cardiologist: Dr Anne Fu Primary Electrophysiologist: Dr [name] (remotely) Referring Physician: Dr Vanessa Barbara Rodney Black is a 55 y.o. male with a history of atrial flutter, bipolar disorder, alcohol abuse, tobacco abuse, tachycardia mediated CM, CAD, HTN, HLD who presents for consultation in the [facility]. He underwent atrial flutter ablation with Dr [name] in 2016. Patient is on Eliquis for a CHADS2VASC score of 3. Patient was seen by Dr Anne Fu on 07/09/20 and found to be back in rapid atrial flutter. He presented for DCCV on 07/15/20 but was back in SR. He had an additional episode of heart racing at work which lasted about 3 hours.   Today, he denies symptoms of chest pain, shortness of breath, orthopnea, PND, lower extremity edema, dizziness, presyncope, syncope, snoring, daytime somnolence, bleeding, or neurologic sequela. The patient is tolerating medications without difficulties and is otherwise without complaint today.    Atrial Fibrillation Risk Factors:  he does not have symptoms or diagnosis of sleep apnea. he does not have a history of rheumatic fever. he does have a history of alcohol use. The patient does not have a history of early familial atrial fibrillation or other arrhythmias.  he has a BMI of There is no height or weight on file to calculate BMI.. There were no vitals filed for this visit.  Family History  Problem Relation Age of Onset   Hypertension Mother    Heart attack Mother 65   Heart disease Mother    Lung cancer Father        heavy smoker     Atrial Fibrillation Management history:  Previous antiarrhythmic drugs: none Previous cardioversions: none Previous ablations: 2016 flutter CHADS2VASC score: 3 Anticoagulation history: Eliquis   Past Medical History:  Diagnosis Date   A-fib (HCC) 03/27/2014   Abnormal nuclear stress test    ACS (acute coronary syndrome) (HCC)  04/20/2019   Alcohol abuse    Alcohol dependence (HCC) 03/27/2014   Alcohol use 05/11/2019   Atrial flutter with rapid ventricular response (HCC) 03/27/2014   unknown duration, newly diagnosed 03/2014   Bipolar 1 disorder (HCC)    Manic depression (HCC)    Mood disorder (HCC) 04/05/2014   Non-ST elevation (NSTEMI) myocardial infarction (HCC) 05/11/2019   Tobacco abuse    Past Surgical History:  Procedure Laterality Date   arm fracture     when patient was young   ATRIAL FLUTTER ABLATION N/A 03/30/2014   Procedure: ATRIAL FLUTTER ABLATION;  Surgeon: [name], MD;  Location: [facility] CATH LAB;  Service: Cardiovascular;  Laterality: N/A;   LEFT HEART CATH AND CORONARY ANGIOGRAPHY N/A 04/26/2019   Procedure: LEFT HEART CATH AND CORONARY ANGIOGRAPHY;  Surgeon: Lennette Bihari, MD;  Location: [facility] INVASIVE CV LAB;  Service: Cardiovascular;  Laterality: N/A;   TEE WITHOUT CARDIOVERSION N/A 03/30/2014   Procedure: TRANSESOPHAGEAL ECHOCARDIOGRAM (TEE);  Surgeon: [name], MD;  Location: [facility] ENDOSCOPY;  Service: Cardiovascular;  Laterality: N/A;    Current Outpatient Medications  Medication Sig Dispense Refill   amLODipine (NORVASC) 10 MG tablet Take 1 tablet (10 mg total) by mouth daily. 90 tablet 3   apixaban (ELIQUIS) 5 MG TABS tablet Take 1 tablet (5 mg total) by mouth 2 (two) times daily. 60 tablet 11   atorvastatin (LIPITOR) 40 MG tablet Take 1 tablet (40 mg total) by mouth daily at 6 PM. 90 tablet 3   carvedilol (COREG) 12.5 MG tablet Take 1 tablet (  12.5 mg total) by mouth 2 (two) times daily. 180 tablet 3   No current facility-administered medications for this visit.    No Known Allergies  Social History   Socioeconomic History   Marital status: Single    Spouse name: Not on file   Number of children: Not on file   Years of education: Not on file   Highest education level: Not on file  Occupational History   Occupation: janitor  Tobacco Use   Smoking status: Every Day    Packs/day:  1.50    Pack years: 0.00    Types: Cigarettes   Smokeless tobacco: Never   Tobacco comments:    1.5 pack per day since high school  Vaping Use   Vaping Use: Never used  Substance and Sexual Activity   Alcohol use: Yes    Alcohol/week: 0.0 standard drinks    Comment: 2x 40 oz beers daily   Drug use: Yes    Types: Marijuana    Comment: occasional marijuana use   Sexual activity: Not on file  Other Topics Concern   Not on file  Social History Narrative   Not on file   Social Determinants of Health   Financial Resource Strain: Not on file  Food Insecurity: Not on file  Transportation Needs: Not on file  Physical Activity: Not on file  Stress: Not on file  Social Connections: Not on file  Intimate Partner Violence: Not on file     ROS- All systems are reviewed and negative except as per the HPI above.  Physical Exam: There were no vitals filed for this visit.  GEN- The patient is a well appearing obese male, alert and oriented x 3 today.   Head- normocephalic, atraumatic Eyes-  Sclera clear, conjunctiva pink Ears- hearing intact Oropharynx- clear Neck- supple  Lungs- Clear to ausculation bilaterally, normal work of breathing Heart- Regular rate and rhythm, no murmurs, rubs or gallops  GI- soft, NT, ND, + BS Extremities- no clubbing, cyanosis, or edema MS- no significant deformity or atrophy Skin- no rash or lesion Psych- euthymic mood, full affect Neuro- strength and sensation are intact  Wt Readings from Last 3 Encounters:  07/09/20 105.2 kg  07/01/20 99.8 kg  03/04/20 103 kg    EKG today demonstrates  SR Vent. rate 65 BPM PR interval 172 ms QRS duration 88 ms QT/QTcB 388/403 ms  Echo 04/22/19 demonstrated  1. Left ventricular ejection fraction, by estimation, is 60 to 65%. The  left ventricle has normal function. The left ventricle has no regional  wall motion abnormalities. Left ventricular diastolic parameters are  consistent with Grade I diastolic   dysfunction (impaired relaxation).   2. Right ventricular systolic function is normal. The right ventricular  size is normal. Tricuspid regurgitation signal is inadequate for assessing  PA pressure.   3. The mitral valve is normal in structure and function. No evidence of  mitral valve regurgitation. No evidence of mitral stenosis.   4. The aortic valve was not well visualized. Aortic valve regurgitation  is not visualized. Mild to moderate aortic valve sclerosis/calcification  is present, without any evidence of aortic stenosis.   5. The inferior vena cava is normal in size with greater than 50%  respiratory variability, suggesting right atrial pressure of 3 mmHg.   Epic records are reviewed at length today  CHA2DS2-VASc Score = 3  The patient's score is based upon: CHF History: Yes HTN History: Yes Diabetes History: No Stroke History: No Vascular Disease  History: Yes Age Score: 0 Gender Score: 0      ASSESSMENT AND PLAN: 1. Typical atrial flutter The patient's CHA2DS2-VASc score is 3, indicating a 3.2% annual risk of stroke.   S/p ablation 2016 We discussed therapeutic options today. Patient agreeable to evaluation by Dr [name] for repeat ablation. Continue Eliquis 5 mg BID Continue Coreg 12.5 mg BID Stress importance of alcohol and tobacco cessation.   2. Secondary Hypercoagulable State (ICD10:  D68.69) The patient is at significant risk for stroke/thromboembolism based upon his CHA2DS2-VASc Score of 3.  Continue Apixaban (Eliquis).   3. Obesity There is no height or weight on file to calculate BMI. Lifestyle modification was discussed at length including regular exercise and weight reduction.  4. CAD No anginal symptoms.  5. HTN Stable, no changes today.  6. Substance abuse Patient smokes 2 packs daily and drinks 3 40 oz beers daily.  Encouraged reduction/cessation.    Follow up with Dr [name].    Jorja Loa PA-C Afib Clinic [facility] [address] [place], [place] [address] [phone] 08/20/2020 8:25 AM

## 2020-08-19 NOTE — Telephone Encounter (Signed)
Left message for patient to call back  

## 2020-08-19 NOTE — Telephone Encounter (Signed)
Called patient back about message. Patient complaining about having irregular heartbeat, palpitations, SOB, sweating, and lightheadedness yesterday while at work. Patient stated he is not having any of these symptoms right now. Patient stated he has had stress at work due to low staffing. Patient stated he was scheduled for cardioverions last month, but it was cancelled due to him being in NS rhythm when he arrived at the hospital. Informed patient that Dr. Anne Fu wanted him to follow up with A. FIB clinic after procedure. Informed patient that we could schedule him with the A. FIB clinic and see if they can help him with these episodes. Patient agreed to see A. FIB clinic. Appointment made for tomorrow at 9:00.     AFIB CLINIC INFORMATION: Your appointment is scheduled on: 08/20/20 at 9:00 am. Please arrive 15 minutes early for check-in. The AFib Clinic is located in the Heart and Vascular Specialty Clinics at [facility]. Parking instructions/directions: Government social research officer [address] ([address]). When you pull in to Entrance C, there is an underground parking garage to your right. The code to enter the garage is 4233. Take the elevators to the first floor. Follow the signs to the Heart and Vascular Specialty Clinics. You will see registration at the end of the hallway.  Phone number: [phone]

## 2020-08-19 NOTE — Telephone Encounter (Signed)
Patient returning call.

## 2020-08-20 ENCOUNTER — Ambulatory Visit (HOSPITAL_COMMUNITY)
Admission: RE | Admit: 2020-08-20 | Discharge: 2020-08-20 | Disposition: A | Discharge status: Home / Self Care | Payer: Medicaid Other | Source: Ambulatory Visit | Attending: Physician Assistant | Admitting: Physician Assistant

## 2020-08-20 ENCOUNTER — Encounter (HOSPITAL_COMMUNITY): Payer: Self-pay | Admitting: Physician Assistant

## 2020-08-20 ENCOUNTER — Other Ambulatory Visit: Payer: Self-pay

## 2020-08-20 VITALS — BP 114/90 | HR 65 | Ht 68.0 in | Wt 228.6 lb

## 2020-08-20 DIAGNOSIS — E669 Obesity, unspecified: Secondary | ICD-10-CM | POA: Insufficient documentation

## 2020-08-20 DIAGNOSIS — Z79899 Other long term (current) drug therapy: Secondary | ICD-10-CM | POA: Insufficient documentation

## 2020-08-20 DIAGNOSIS — E785 Hyperlipidemia, unspecified: Secondary | ICD-10-CM | POA: Insufficient documentation

## 2020-08-20 DIAGNOSIS — D6869 Other thrombophilia: Secondary | ICD-10-CM | POA: Insufficient documentation

## 2020-08-20 DIAGNOSIS — I10 Essential (primary) hypertension: Secondary | ICD-10-CM | POA: Insufficient documentation

## 2020-08-20 DIAGNOSIS — I483 Typical atrial flutter: Secondary | ICD-10-CM | POA: Insufficient documentation

## 2020-08-20 DIAGNOSIS — F1010 Alcohol abuse, uncomplicated: Secondary | ICD-10-CM | POA: Insufficient documentation

## 2020-08-20 DIAGNOSIS — F17210 Nicotine dependence, cigarettes, uncomplicated: Secondary | ICD-10-CM | POA: Insufficient documentation

## 2020-08-20 DIAGNOSIS — Z8249 Family history of ischemic heart disease and other diseases of the circulatory system: Secondary | ICD-10-CM | POA: Insufficient documentation

## 2020-08-20 DIAGNOSIS — I4891 Unspecified atrial fibrillation: Secondary | ICD-10-CM | POA: Insufficient documentation

## 2020-08-20 DIAGNOSIS — Z7901 Long term (current) use of anticoagulants: Secondary | ICD-10-CM | POA: Insufficient documentation

## 2020-08-20 DIAGNOSIS — I2510 Atherosclerotic heart disease of native coronary artery without angina pectoris: Secondary | ICD-10-CM | POA: Insufficient documentation

## 2020-08-20 DIAGNOSIS — F319 Bipolar disorder, unspecified: Secondary | ICD-10-CM | POA: Insufficient documentation

## 2020-09-16 ENCOUNTER — Other Ambulatory Visit: Payer: Self-pay

## 2020-09-17 ENCOUNTER — Other Ambulatory Visit: Payer: Self-pay

## 2020-09-17 ENCOUNTER — Encounter: Payer: Self-pay | Admitting: Internal Medicine

## 2020-09-17 ENCOUNTER — Ambulatory Visit (INDEPENDENT_AMBULATORY_CARE_PROVIDER_SITE_OTHER): Payer: Self-pay | Admitting: Internal Medicine

## 2020-09-17 VITALS — BP 140/82 | HR 76 | Ht 68.0 in | Wt 225.4 lb

## 2020-09-17 DIAGNOSIS — I483 Typical atrial flutter: Secondary | ICD-10-CM

## 2020-09-17 NOTE — Progress Notes (Signed)
HPI Rodney Black is referred by Mr. Fenton for evaluation of typical atrial flutter. He is a pleasant 55 yo man with a h/o tobacco and ETOH abuse who is s/p NSTEMI remotely. He has had PAF and is referred for additional evaluation and management. He has reverted back to NSR. He denies chest pain. He has a problem with his ability to walk and stand. I suspect that he may have some spinal cord difficulty. He denies syncope or near syncope. He feels class 2 dyspnea when he is out of rhythm.  No Known Allergies   Current Outpatient Medications  Medication Sig Dispense Refill   acetaminophen (TYLENOL) 500 MG tablet Take 500 mg by mouth every 6 (six) hours as needed for mild pain or moderate pain.     amLODipine (NORVASC) 10 MG tablet Take 1 tablet (10 mg total) by mouth daily. 90 tablet 3   apixaban (ELIQUIS) 5 MG TABS tablet Take 1 tablet (5 mg total) by mouth 2 (two) times daily. 60 tablet 11   atorvastatin (LIPITOR) 40 MG tablet Take 1 tablet (40 mg total) by mouth daily at 6 PM. 90 tablet 3   carvedilol (COREG) 12.5 MG tablet Take 1 tablet (12.5 mg total) by mouth 2 (two) times daily. 180 tablet 3   No current facility-administered medications for this visit.     Past Medical History:  Diagnosis Date   A-fib (HCC) 03/27/2014   Abnormal nuclear stress test    ACS (acute coronary syndrome) (HCC) 04/20/2019   Alcohol abuse    Alcohol dependence (HCC) 03/27/2014   Alcohol use 05/11/2019   Atrial flutter with rapid ventricular response (HCC) 03/27/2014   unknown duration, newly diagnosed 03/2014   Bipolar 1 disorder (HCC)    Manic depression (HCC)    Mood disorder (HCC) 04/05/2014   Non-ST elevation (NSTEMI) myocardial infarction (HCC) 05/11/2019   Tobacco abuse     ROS:   All systems reviewed and negative except as noted in the HPI.   Past Surgical History:  Procedure Laterality Date   arm fracture     when patient was young   ATRIAL FLUTTER ABLATION N/A 03/30/2014   Procedure:  ATRIAL FLUTTER ABLATION;  Surgeon: [name], MD;  Location: [facility] CATH LAB;  Service: Cardiovascular;  Laterality: N/A;   LEFT HEART CATH AND CORONARY ANGIOGRAPHY N/A 04/26/2019   Procedure: LEFT HEART CATH AND CORONARY ANGIOGRAPHY;  Surgeon: Lennette Bihari, MD;  Location: [facility] INVASIVE CV LAB;  Service: Cardiovascular;  Laterality: N/A;   TEE WITHOUT CARDIOVERSION N/A 03/30/2014   Procedure: TRANSESOPHAGEAL ECHOCARDIOGRAM (TEE);  Surgeon: [name], MD;  Location: [facility] ENDOSCOPY;  Service: Cardiovascular;  Laterality: N/A;     Family History  Problem Relation Age of Onset   Hypertension Mother    Heart attack Mother 78   Heart disease Mother    Lung cancer Father        heavy smoker     Social History   Socioeconomic History   Marital status: Single    Spouse name: Not on file   Number of children: Not on file   Years of education: Not on file   Highest education level: Not on file  Occupational History   Occupation: janitor  Tobacco Use   Smoking status: Every Day    Packs/day: 2.00    Types: Cigarettes   Smokeless tobacco: Never   Tobacco comments:    2 pack per day since high school  Vaping  Use   Vaping Use: Never used  Substance and Sexual Activity   Alcohol use: Yes    Comment: 3x 40 oz beers daily   Drug use: Yes    Types: Marijuana    Comment: occasional marijuana use   Sexual activity: Not on file  Other Topics Concern   Not on file  Social History Narrative   Not on file   Social Determinants of Health   Financial Resource Strain: Not on file  Food Insecurity: Not on file  Transportation Needs: Not on file  Physical Activity: Not on file  Stress: Not on file  Social Connections: Not on file  Intimate Partner Violence: Not on file     BP 140/82   Pulse 76   Ht 5\' 8"  (1.727 m)   Wt 225 lb 6.4 oz (102.2 kg)   SpO2 95%   BMI 34.27 kg/m   Physical Exam:  Dyskempt Well appearing NAD HEENT: Unremarkable Neck:  No JVD, no  thyromegally Lymphatics:  No adenopathy Back:  No CVA tenderness Lungs:  Clear HEART:  Regular rate rhythm, no murmurs, no rubs, no clicks Abd:  soft, positive bowel sounds, no organomegally, no rebound, no guarding Ext:  2 plus pulses, no edema, no cyanosis, no clubbing Skin:  No rashes no nodules Neuro:  CN II through XII intact, motor grossly intact  EKG - reviewed. Atrial flutter with a cvr  Assess/Plan:  Paroxysmal atrial flutter - I have discussed the treatment options. He is maintaining NSR. He will call if he would like to pursue ablation. He does not have insurance. He will call us if he is able to proceed with ablation.  HTN - he will continue his current meds. Tobacco abuse - I strongly encouraged him to reduce his salt intake.   [place] Nawal Burling,MD

## 2020-09-17 NOTE — Patient Instructions (Addendum)
Medication Instructions:  Your physician recommends that you continue on your current medications as directed. Please refer to the Current Medication list given to you today.  Labwork: None ordered.  Testing/Procedures: None ordered.  Follow-Up: Your physician wants you to follow-up in: as needed with Gregg Taylor, MD    Any Other Special Instructions Will Be Listed Below (If Applicable).  If you need a refill on your cardiac medications before your next appointment, please call your pharmacy.       

## 2020-10-18 ENCOUNTER — Other Ambulatory Visit: Payer: Self-pay

## 2020-10-22 ENCOUNTER — Other Ambulatory Visit: Payer: Self-pay

## 2020-11-22 ENCOUNTER — Other Ambulatory Visit: Payer: Self-pay

## 2020-11-26 ENCOUNTER — Other Ambulatory Visit: Payer: Self-pay

## 2020-11-26 ENCOUNTER — Ambulatory Visit (HOSPITAL_COMMUNITY)
Admission: EM | Admit: 2020-11-26 | Discharge: 2020-11-26 | Disposition: A | Discharge status: Home / Self Care | Payer: No Payment, Other | Attending: Psychiatry | Admitting: Psychiatry

## 2020-11-26 DIAGNOSIS — F1010 Alcohol abuse, uncomplicated: Secondary | ICD-10-CM | POA: Insufficient documentation

## 2020-11-26 DIAGNOSIS — F17210 Nicotine dependence, cigarettes, uncomplicated: Secondary | ICD-10-CM | POA: Insufficient documentation

## 2020-11-26 DIAGNOSIS — F1994 Other psychoactive substance use, unspecified with psychoactive substance-induced mood disorder: Secondary | ICD-10-CM

## 2020-11-26 NOTE — ED Notes (Signed)
Pt discharged with  AVS.  AVS reviewed prior to discharge.  Pt alert, oriented, and ambulatory.  Safety maintained.  °

## 2020-11-26 NOTE — BH Assessment (Signed)
Pt reports he has been drinking for years and reports he has been drinking 3-40oz alcohol for "many years". Pt reports drinking 3-40oz and smoking a couple joints of THC daily. Pt denies SI, HI, AVH. Reports stressors of his roommate died last week and not being able to afford rent. Pt reports his twin sister died 5 years ago. Pt does not have outpt services. Reports suicide attempting in 1990 and went to [facility] for 2 weeks. Pt is looking for SA treatment.  Pt is routine

## 2020-11-26 NOTE — ED Provider Notes (Signed)
Behavioral Health Urgent Care Medical Screening Exam  Patient Name: Rodney Black MRN: [id number] Date of Evaluation: 11/26/20 Chief Complaint:   Diagnosis:  Final diagnoses:  None    History of Present illness: Rodney Black is a 55 y.o. male patient presented to [facility] as a walk in alone with complaints of  "I need help to quit drinking".  Rodney Black, 55 y.o., male patient seen face to face by this provider, consulted with Dr. Lucianne Muss; and chart reviewed on 11/26/20.  On evaluation Rodney Black reports he has a history of bipolar 1 with mania and alcohol use disorder.  States he has consumed alcohol for the past 30 years.  He drinks 3-40's of beer daily.  He has never had any periods of sobriety.  Denies any history of seizures or delirium tremors.  He smokes 1 blunt of marijuana daily.  He smokes 2 packs of cigarettes daily.  States he lost his twin sister 5 years ago and has not dealt with that loss.  His roommate died last week and he will be losing his housing soon.  States all of these factors has increased his depression.  Patient is requesting help with detox in a residential program.  States he would not be able to begin treatment until next week.  During evaluation Rodney Black is in sitting position in no acute distress.  He is fairly groomed and makes good eye contact.  His speech is clear, coherent, normal rate and tone.  He is alert/oriented x 4; calm/cooperative. Endorses depression with congruent affect.  Reports feeling hopeless and worthless.  States he eats 1 meal per day.  He only sleeps when he drinks alcohol, 6-7 hours per night.  His pain thought process is coherent and relevant; There is no indication that he is currently responding to internal/external stimuli or experiencing delusional thought content; and he has denied suicidal/self-harm/homicidal ideation, psychosis, and paranoia.   Patient has remained calm throughout assessment and has answered  questions appropriately.    Discussed residential treatment facilities with patient.  Provided resources patient agrees that he will call facilities and do the intake interview.  Also provided outpatient psychiatric resources, including [facility] behavioral health outpatient services on the second floor including open access walk-in hours  At this time Rodney Black is educated and verbalizes understanding of mental health resources and other crisis services in the community. He is instructed to call 911 and present to the nearest emergency his mental health condition.       Psychiatric Specialty Exam  Presentation  General Appearance:Casual  Eye Contact:Good  Speech:Clear and Coherent; Normal Rate  Speech Volume:Normal  Handedness:Right   Mood and Affect  Mood:Depressed  Affect:Congruent   Thought Process  Thought Processes:Coherent  Descriptions of Associations:Intact  Orientation:Full (Time, Place and Person)  Thought Content:Logical    Hallucinations:None  Ideas of Reference:None  Suicidal Thoughts:No  Homicidal Thoughts:No   Sensorium  Memory:Immediate Good; Recent Good; Remote Good  Judgment:Fair  Insight:Fair   Executive Functions  Concentration:Good  Attention Span:Good  Recall:Good  Fund of Knowledge:Good  Language:Good   Psychomotor Activity  Psychomotor Activity:Normal   Assets  Assets:Communication Skills; Desire for Improvement; Financial Resources/Insurance; Housing; Physical Health; Resilience; Transportation   Sleep  Sleep:Fair  Number of hours: 6   No data recorded  Physical Exam: Physical Exam Vitals and nursing note reviewed.  Constitutional:      Appearance: He is well-developed.  HENT:     Head: Normocephalic  and atraumatic.  Eyes:     Conjunctiva/sclera: Conjunctivae normal.  Cardiovascular:     Rate and Rhythm: Normal rate.     Heart sounds: No murmur heard. Pulmonary:     Effort: Pulmonary  effort is normal. No respiratory distress.  Musculoskeletal:     Cervical back: Normal range of motion.  Skin:    General: Skin is warm and dry.  Neurological:     Mental Status: He is alert and oriented to person, place, and time.  Psychiatric:        Attention and Perception: Attention and perception normal.        Mood and Affect: Mood is depressed.        Speech: Speech normal.        Behavior: Behavior normal. Behavior is cooperative.        Thought Content: Thought content normal.        Cognition and Memory: Cognition normal.        Judgment: Judgment is impulsive.   Review of Systems  Constitutional: Negative.   HENT: Negative.    Eyes: Negative.   Respiratory: Negative.    Cardiovascular: Negative.   Musculoskeletal: Negative.   Skin: Negative.   Neurological: Negative.   Psychiatric/Behavioral:  Positive for depression.   Blood pressure (!) 132/92, pulse 79, temperature 98.9 F (37.2 C), temperature source Oral, resp. rate 16, SpO2 99 %. There is no height or weight on file to calculate BMI.  Musculoskeletal: Strength & Muscle Tone: within normal limits Gait & Station: normal Patient leans: N/A   [facility] MSE Discharge Disposition for Follow up and Recommendations: Based on my evaluation the patient does not appear to have an emergency medical condition and can be discharged with resources and follow up care in outpatient services for Medication Management and Substance Abuse Intensive Outpatient Program  Discharge patient.  Provided outpatient psychiatric resources including [facility] behavioral health outpatient services on the second floor including open access walk-in hours..  Provided shelter resources.  Provided a list of residential treatment facilities.  No evidence of imminent risk to self or others at present.    Patient does not meet criteria for psychiatric inpatient admission. Discussed crisis plan, support from social network, calling 911,  coming to the Emergency Department, and calling Suicide Hotline.    Ardis Hughs, NP 11/26/2020, 10:51 AM With mania and depression and alcohol use disorder

## 2020-11-26 NOTE — Discharge Instructions (Addendum)

## 2020-12-02 ENCOUNTER — Encounter (HOSPITAL_COMMUNITY): Payer: Self-pay

## 2020-12-02 ENCOUNTER — Emergency Department (HOSPITAL_COMMUNITY)
Admission: EM | Admit: 2020-12-02 | Discharge: 2020-12-03 | Disposition: A | Discharge status: Home / Self Care | Payer: Medicaid Other | Attending: Emergency Medicine | Admitting: Emergency Medicine

## 2020-12-02 ENCOUNTER — Other Ambulatory Visit: Payer: Self-pay

## 2020-12-02 DIAGNOSIS — Z7901 Long term (current) use of anticoagulants: Secondary | ICD-10-CM | POA: Insufficient documentation

## 2020-12-02 DIAGNOSIS — Z79899 Other long term (current) drug therapy: Secondary | ICD-10-CM | POA: Insufficient documentation

## 2020-12-02 DIAGNOSIS — F10230 Alcohol dependence with withdrawal, uncomplicated: Secondary | ICD-10-CM | POA: Insufficient documentation

## 2020-12-02 DIAGNOSIS — I2510 Atherosclerotic heart disease of native coronary artery without angina pectoris: Secondary | ICD-10-CM | POA: Insufficient documentation

## 2020-12-02 DIAGNOSIS — F17210 Nicotine dependence, cigarettes, uncomplicated: Secondary | ICD-10-CM | POA: Insufficient documentation

## 2020-12-02 DIAGNOSIS — Y900 Blood alcohol level of less than 20 mg/100 ml: Secondary | ICD-10-CM | POA: Insufficient documentation

## 2020-12-02 DIAGNOSIS — I119 Hypertensive heart disease without heart failure: Secondary | ICD-10-CM | POA: Insufficient documentation

## 2020-12-02 DIAGNOSIS — F1090 Alcohol use, unspecified, uncomplicated: Secondary | ICD-10-CM | POA: Insufficient documentation

## 2020-12-02 DIAGNOSIS — R110 Nausea: Secondary | ICD-10-CM | POA: Insufficient documentation

## 2020-12-02 DIAGNOSIS — Z789 Other specified health status: Secondary | ICD-10-CM

## 2020-12-02 LAB — COMPREHENSIVE METABOLIC PANEL
ALT: 51 U/L — ABNORMAL HIGH (ref 0–44)
AST: 29 U/L (ref 15–41)
Albumin: 4.7 g/dL (ref 3.5–5.0)
Alkaline Phosphatase: 73 U/L (ref 38–126)
Anion gap: 11 (ref 5–15)
BUN: 10 mg/dL (ref 6–20)
CO2: 23 mmol/L (ref 22–32)
Calcium: 9.8 mg/dL (ref 8.9–10.3)
Chloride: 103 mmol/L (ref 98–111)
Creatinine, Ser: 0.8 mg/dL (ref 0.61–1.24)
GFR, Estimated: >60 mL/min (ref >60)
Glucose, Bld: 102 mg/dL — ABNORMAL HIGH (ref 70–99)
Potassium: 3.9 mmol/L (ref 3.5–5.1)
Sodium: 137 mmol/L (ref 135–145)
Total Bilirubin: 1.1 mg/dL (ref 0.3–1.2)
Total Protein: 8.1 g/dL (ref 6.5–8.1)

## 2020-12-02 LAB — CBC WITH DIFFERENTIAL/PLATELET
Abs Immature Granulocytes: 0.02 10*3/uL (ref 0.00–0.07)
Basophils Absolute: 0.1 10*3/uL (ref 0.0–0.1)
Basophils Relative: 2 %
Eosinophils Absolute: 0.1 10*3/uL (ref 0.0–0.5)
Eosinophils Relative: 1 %
HCT: 53.1 % — ABNORMAL HIGH (ref 39.0–52.0)
Hemoglobin: 18.1 g/dL — ABNORMAL HIGH (ref 13.0–17.0)
Immature Granulocytes: 0 %
Lymphocytes Relative: 31 %
Lymphs Abs: 2.8 10*3/uL (ref 0.7–4.0)
MCH: 33.7 pg (ref 26.0–34.0)
MCHC: 34.1 g/dL (ref 30.0–36.0)
MCV: 98.9 fL (ref 80.0–100.0)
Monocytes Absolute: 0.7 10*3/uL (ref 0.1–1.0)
Monocytes Relative: 8 %
Neutro Abs: 5.2 10*3/uL (ref 1.7–7.7)
Neutrophils Relative %: 58 %
Platelets: 239 10*3/uL (ref 150–400)
RBC: 5.37 MIL/uL (ref 4.22–5.81)
RDW: 13.2 % (ref 11.5–15.5)
WBC: 8.9 10*3/uL (ref 4.0–10.5)
nRBC: 0 % (ref 0.0–0.2)

## 2020-12-02 LAB — ETHANOL: Alcohol, Ethyl (B): <10 mg/dL (ref <10)

## 2020-12-02 NOTE — ED Triage Notes (Addendum)
Patient states he needs help with alcohol, marijuana, and cigarette use. Patient states he has been consuming x 35 years and it is starting to effect his health. Patient states he normally drinks 3 40's a day and his last drink was last night.   Patient states that he has been accepted to [facility], but needs detox first.

## 2020-12-02 NOTE — ED Provider Notes (Signed)
Emergency Medicine Provider Triage Evaluation Note  Rodney Black , a 55 y.o. male  was evaluated in triage.  Pt complains of alcohol detox.  Patient reports that he has been drinking alcohol regularly for 35 years.  States that last alcohol intake was yesterday evening and he drinks three 40 ounce beers.  Review of Systems  Positive: Anxiety Negative: SI, HI, AVH, tactile hallucinations, tremors, seizures  Physical Exam  BP (!) 146/106 (BP Location: Right Arm)   Pulse 78   Temp 98.7 F (37.1 C) (Oral)   Resp 16   Ht 5\' 8"  (1.727 m)   Wt 96.6 kg   SpO2 100%   BMI 32.39 kg/m  Gen:   Awake, no distress   Resp:  Normal effort  MSK:   Moves extremities without difficulty  Other:    Medical Decision Making  Medically screening exam initiated at 3:12 PM.  Appropriate orders placed.  CHAISE MAHABIR was informed that the remainder of the evaluation will be completed by another provider, this initial triage assessment does not replace that evaluation, and the importance of remaining in the ED until their evaluation is complete.     [name], PA-C 12/02/20 1513    02/01/21, MD 12/02/20 1556

## 2020-12-03 ENCOUNTER — Other Ambulatory Visit: Payer: Self-pay

## 2020-12-03 MED ORDER — CHLORDIAZEPOXIDE HCL 25 MG PO CAPS
100.0000 mg | ORAL_CAPSULE | Freq: Once | ORAL | Status: AC
  Administered 2020-12-03: 100 mg via ORAL
  Filled 2020-12-03: qty 4

## 2020-12-03 MED ORDER — CHLORDIAZEPOXIDE HCL 25 MG PO CAPS
ORAL_CAPSULE | ORAL | 0 refills | Status: AC
  Filled 2020-12-03: qty 10, 3d supply, fill #0

## 2020-12-03 NOTE — ED Provider Notes (Signed)
[facility]-EMERGENCY DEPT Provider Note   CSN: [id number] Arrival date & time: 12/02/20  1344     History Chief Complaint  Patient presents with   detox    Rodney Black is a 55 y.o. male without with a hx of EtOH abuse, bipolar 1 disorder, afib, hypertension, and CAD who presents to the ED requesting detox. He states he has been drinking 3 forty ounce beers daily for about 35 years now, wants to stop drinking, last drink was last night. Went to a behavioral health center who instructed him he will need detox care. He has had some mild nausea in the mornings recently- none at present. Recent death of his roommate has lead him to want to get sober. He denies SI, HI, hallucinations, seizures, or tremors.   HPI     Past Medical History:  Diagnosis Date   A-fib (HCC) 03/27/2014   Abnormal nuclear stress test    ACS (acute coronary syndrome) (HCC) 04/20/2019   Alcohol abuse    Alcohol dependence (HCC) 03/27/2014   Alcohol use 05/11/2019   Atrial flutter with rapid ventricular response (HCC) 03/27/2014   unknown duration, newly diagnosed 03/2014   Bipolar 1 disorder (HCC)    Manic depression (HCC)    Mood disorder (HCC) 04/05/2014   Non-ST elevation (NSTEMI) myocardial infarction (HCC) 05/11/2019   Tobacco abuse     Patient Active Problem List   Diagnosis Date Noted   Secondary hypercoagulable state (HCC) 08/20/2020   Non-ST elevation (NSTEMI) myocardial infarction (HCC) 05/11/2019   Alcohol use 05/11/2019   Abnormal nuclear stress test    ACS (acute coronary syndrome) (HCC) 04/20/2019   Essential hypertension 04/05/2014   Mood disorder (HCC) 04/05/2014   Atrial flutter with rapid ventricular response (HCC) 03/27/2014   Tobacco use 03/27/2014   Alcohol dependence (HCC) 03/27/2014   A-fib (HCC) 03/27/2014    Past Surgical History:  Procedure Laterality Date   arm fracture     when patient was young   ATRIAL FLUTTER ABLATION N/A 03/30/2014   Procedure:  ATRIAL FLUTTER ABLATION;  Surgeon: [name], MD;  [facility] CATH LAB;  Service: Cardiovascular;  Laterality: N/A;   LEFT HEART CATH AND CORONARY ANGIOGRAPHY N/A 04/26/2019   Procedure: LEFT HEART CATH AND CORONARY ANGIOGRAPHY;  Surgeon: Lennette Bihari, MD;  Location: [facility] INVASIVE CV LAB;  Service: Cardiovascular;  Laterality: N/A;   TEE WITHOUT CARDIOVERSION N/A 03/30/2014   Procedure: TRANSESOPHAGEAL ECHOCARDIOGRAM (TEE);  Surgeon: [name], MD;  Location: [facility] ENDOSCOPY;  Service: Cardiovascular;  Laterality: N/A;       Family History  Problem Relation Age of Onset   Hypertension Mother    Heart attack Mother 71   Heart disease Mother    Lung cancer Father        heavy smoker    Social History   Tobacco Use   Smoking status: Every Day    Packs/day: 2.00    Types: Cigarettes   Smokeless tobacco: Never   Tobacco comments:    2 pack per day since high school  Vaping Use   Vaping Use: Never used  Substance Use Topics   Alcohol use: Yes    Comment: 3x 40 oz beers daily   Drug use: Yes    Types: Marijuana    Home Medications Prior to Admission medications   Medication Sig Start Date End Date Taking? Authorizing Provider  acetaminophen (TYLENOL) 500 MG tablet Take 500 mg by mouth every 6 (six)  hours as needed for mild pain or moderate pain.    [provider]  amLODipine (NORVASC) 10 MG tablet Take 1 tablet (10 mg total) by mouth daily. 07/09/20 12/26/20  Jake Bathe, MD  apixaban (ELIQUIS) 5 MG TABS tablet Take 1 tablet (5 mg total) by mouth 2 (two) times daily. 07/09/20   Jake Bathe, MD  atorvastatin (LIPITOR) 40 MG tablet Take 1 tablet (40 mg total) by mouth daily at 6 PM. 07/09/20   Jake Bathe, MD  carvedilol (COREG) 12.5 MG tablet Take 1 tablet (12.5 mg total) by mouth 2 (two) times daily. 07/09/20 12/26/20  Jake Bathe, MD    Allergies    Patient has no known allergies.  Review of Systems   Review of Systems  Constitutional:  Negative for  fever.  Respiratory:  Negative for shortness of breath.   Cardiovascular:  Negative for chest pain.  Gastrointestinal:  Positive for nausea (not at present). Negative for abdominal pain.  Neurological:  Negative for tremors and syncope.  Psychiatric/Behavioral:  Negative for hallucinations and suicidal ideas.   All other systems reviewed and are negative.  Physical Exam Updated Vital Signs BP (!) 133/94   Pulse 63   Temp 98.7 F (37.1 C) (Oral)   Resp 18   Ht 5\' 8"  (1.727 m)   Wt 96.6 kg   SpO2 99%   BMI 32.39 kg/m   Physical Exam Vitals and nursing note reviewed.  Constitutional:      General: He is not in acute distress.    Appearance: He is well-developed. He is not toxic-appearing.  HENT:     Head: Normocephalic and atraumatic.  Eyes:     General:        Right eye: No discharge.        Left eye: No discharge.     Conjunctiva/sclera: Conjunctivae normal.  Cardiovascular:     Rate and Rhythm: Normal rate and regular rhythm.  Pulmonary:     Effort: Pulmonary effort is normal. No respiratory distress.     Breath sounds: Normal breath sounds. No wheezing, rhonchi or rales.  Abdominal:     General: There is no distension.     Palpations: Abdomen is soft.     Tenderness: There is no abdominal tenderness.  Musculoskeletal:     Cervical back: Neck supple.  Skin:    General: Skin is warm and dry.     Findings: No rash.  Neurological:     Mental Status: He is alert.     Comments: Clear speech.   Psychiatric:        Behavior: Behavior normal.        Thought Content: Thought content does not include homicidal or suicidal ideation.    ED Results / Procedures / Treatments   Labs (all labs ordered are listed, but only abnormal results are displayed) Labs Reviewed  COMPREHENSIVE METABOLIC PANEL - Abnormal; Notable for the following components:      Result Value   Glucose, Bld 102 (*)    ALT 51 (*)    All other components within normal limits  CBC WITH  DIFFERENTIAL/PLATELET - Abnormal; Notable for the following components:   Hemoglobin 18.1 (*)    HCT 53.1 (*)    All other components within normal limits  ETHANOL  RAPID URINE DRUG SCREEN, HOSP PERFORMED    EKG None  Radiology No results found.  Procedures Procedures   Medications Ordered in ED Medications - No data to display  ED Course  I have reviewed the triage vital signs and the nursing notes.  Pertinent labs & imaging results that were available during my care of the patient were reviewed by me and considered in my medical decision making (see chart for details).    MDM Rules/Calculators/A&P                           Patient presents to the ED requesting EtOH detox.  Nontoxic, BP elevated some- doubt HTN emergency.  Has had some AM nausea- none at present. Mild anxiety. CIWA < 7.   Additional history obtained:  Additional history obtained from chart review & nursing note review.   Lab Tests:  I reviewed and interpreted labs, which included:  CBC: mild hgb/hct elevation CMP: mld alt elevation Ethanol: Negative.   ED Course:  Patient with CIWA < 7, has been present in the ED 10 hours now and appears to be doing well, does not require admission for EtOH withdrawal at this time. He denies hx of withdrawal, Dts, or seizures, but states he has not really ever tried to stop drinking. Will give dose of librium in the ED, prescribe librium taper and provide resources as well as place consult to peer support. I discussed results, treatment plan, need for follow-up, and return precautions with the patient. Provided opportunity for questions, patient confirmed understanding and is in agreement with plan.   Portions of this note were generated with Scientist, clinical (histocompatibility and immunogenetics). Dictation errors may occur despite best attempts at proofreading.  Final Clinical Impression(s) / ED Diagnoses Final diagnoses:  Alcohol use    Rx / DC Orders ED Discharge Orders           Ordered    chlordiazePOXIDE (LIBRIUM) 25 MG capsule        12/03/20 0015             Shaheim Mahar, Pleas Koch, PA-C 12/03/20 0024    Nira Conn, MD 12/03/20 [phone]

## 2020-12-03 NOTE — Discharge Instructions (Addendum)
You were seen in the ER today for detox.  Your labs overall were reassuring.  We are sending you a librium taper to [facility] and gave you a dose of this tonight.   Please do not drink alcohol with this medicine.   We have prescribed you new medication(s) today. Discuss the medications prescribed today with your pharmacist as they can have adverse effects and interactions with your other medicines including over the counter and prescribed medications. Seek medical evaluation if you start to experience new or abnormal symptoms after taking one of these medicines, seek care immediately if you start to experience difficulty breathing, feeling of your throat closing, facial swelling, or rash as these could be indications of a more serious allergic reaction  Please follow up with additional resources for detox. We have also contacted our peer support.   Return to the ER for new or worsening symptoms including but not limited to new or worsening pain, hallucinations, shakiness, vomiting, seizure activity, passing out, or any other concerns.

## 2020-12-10 ENCOUNTER — Other Ambulatory Visit: Payer: Self-pay

## 2021-01-15 ENCOUNTER — Other Ambulatory Visit: Payer: Self-pay

## 2022-05-23 IMAGING — CR DG RIBS W/ CHEST 3+V*R*
3 series · 3 of 3 positions shown · non-contrast
Comparison: April 20, 2019

CLINICAL DATA: Pain after coughing

EXAM:
RIGHT RIBS AND CHEST - 3+ VIEW

[w chest pa]
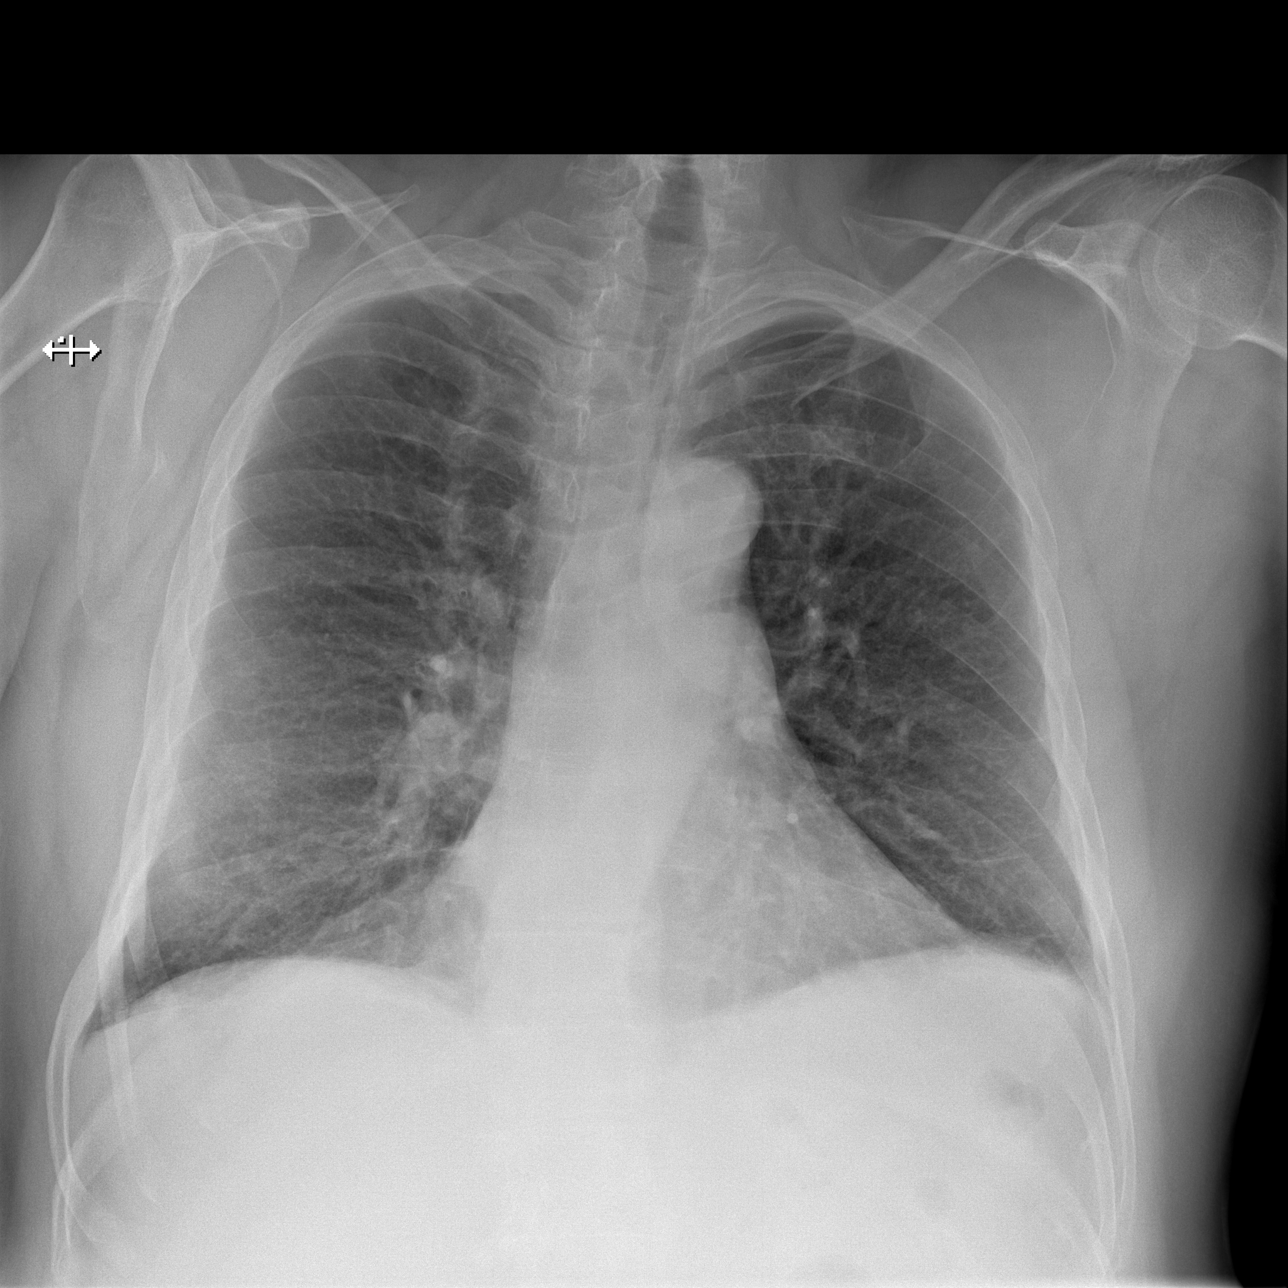

[w ribs ap lower right (1 of 2)]
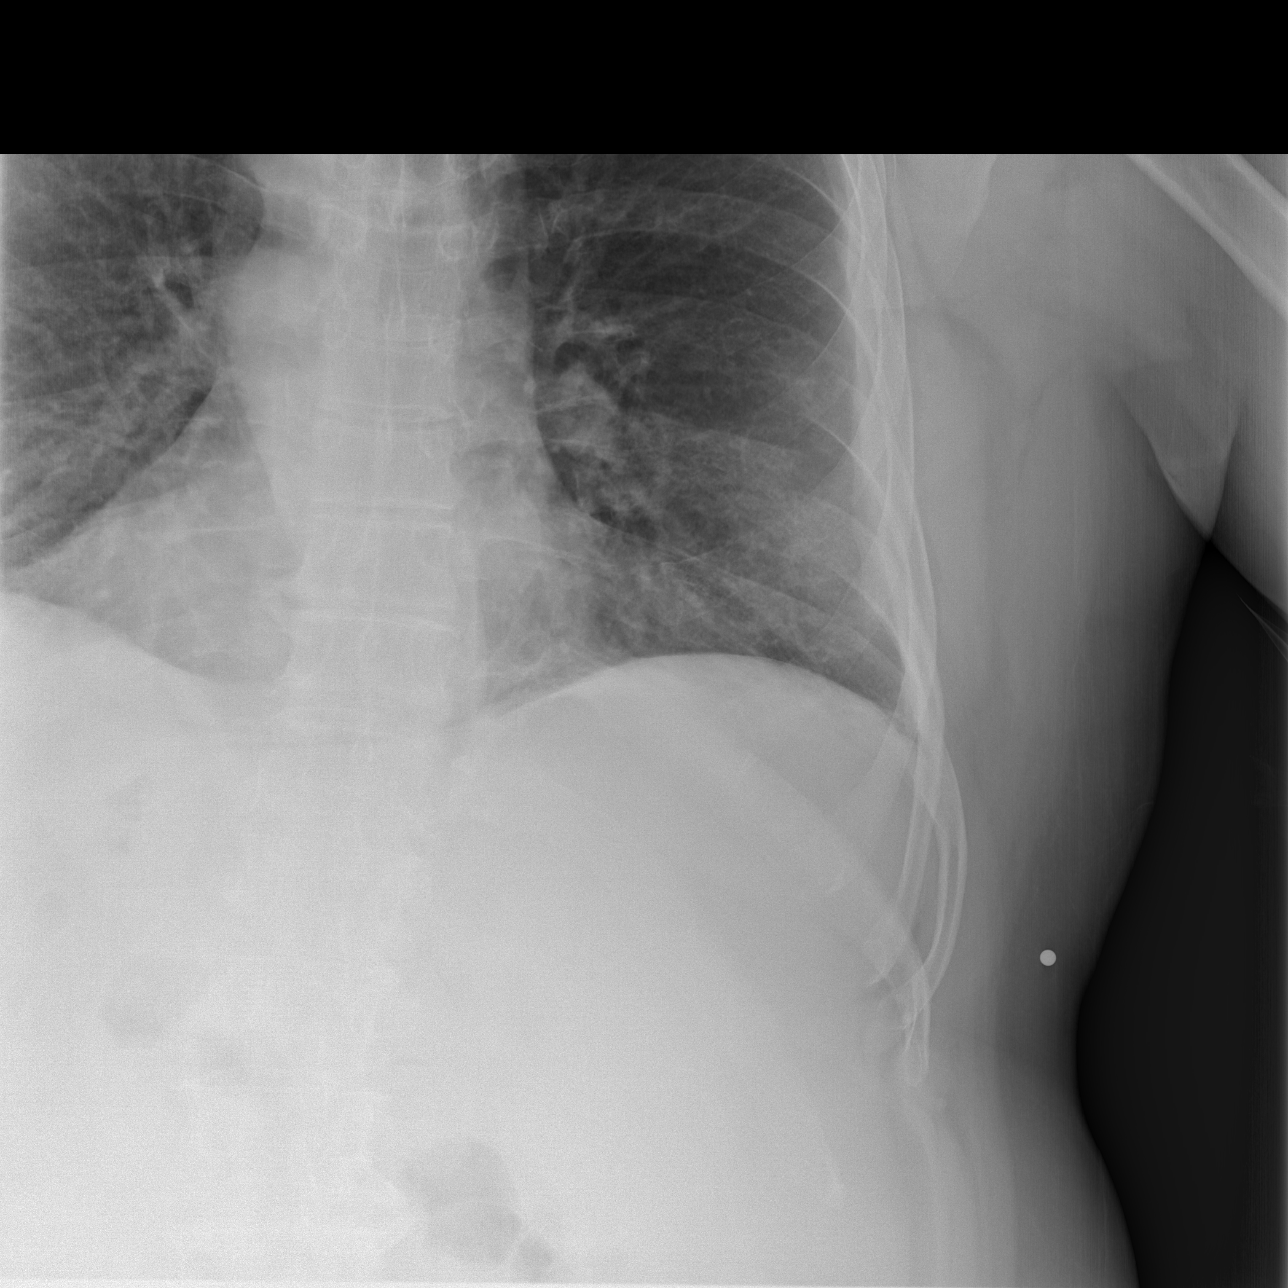

[w ribs ap lower right (2 of 2)]
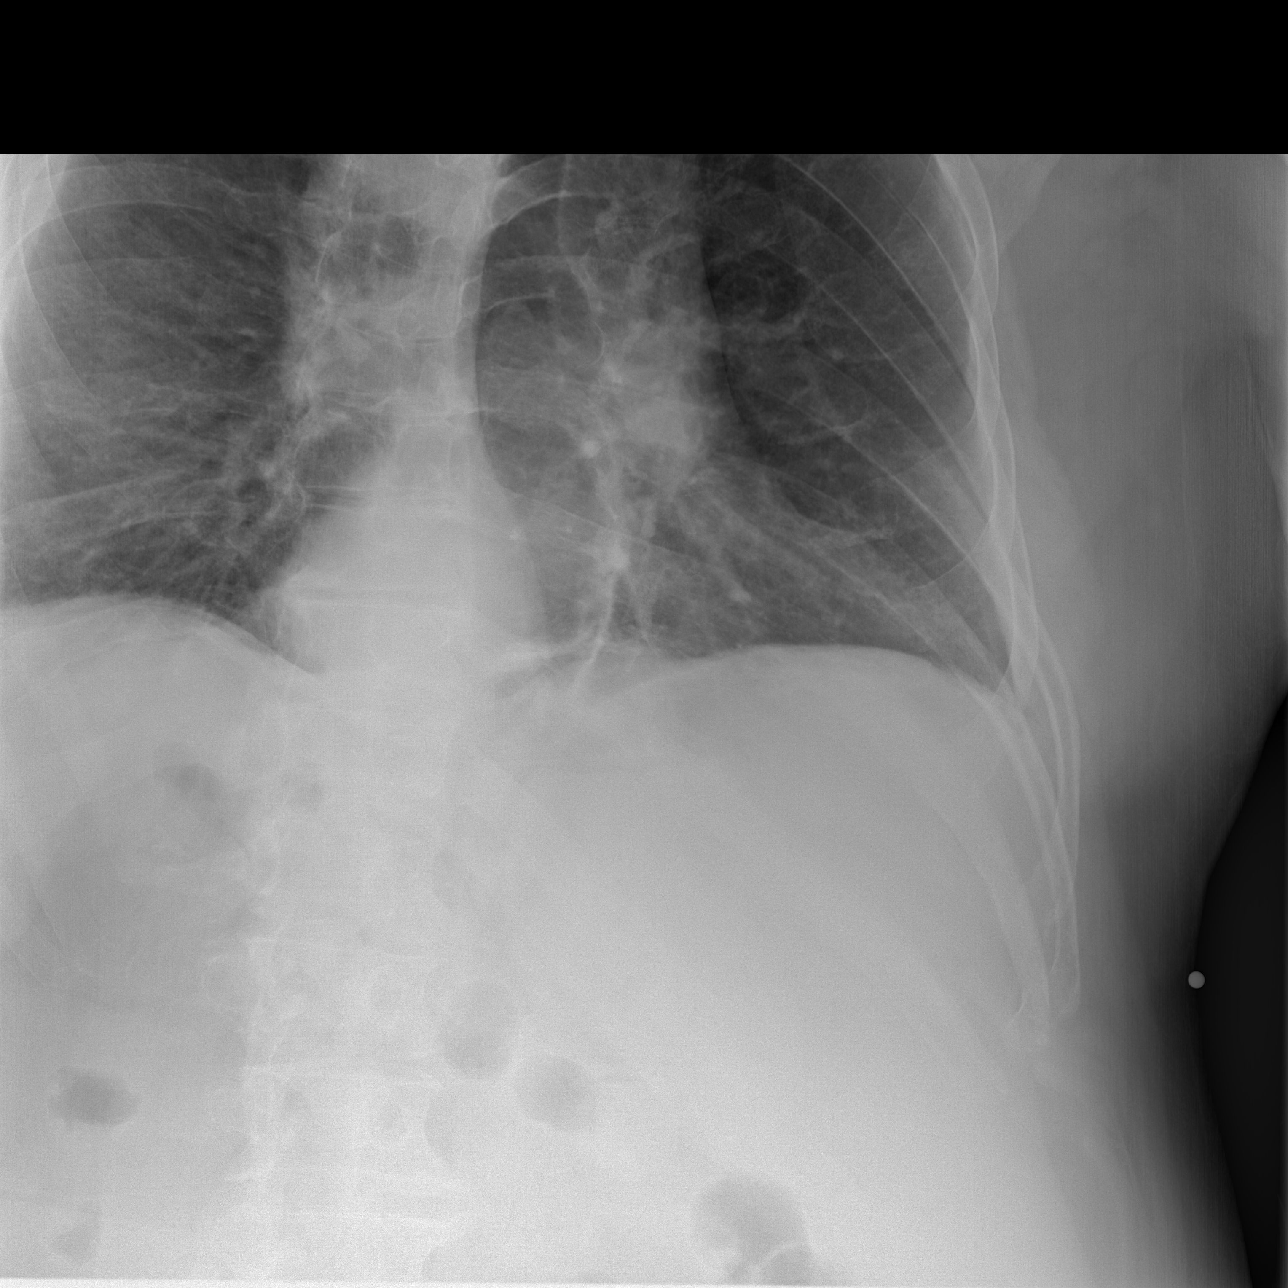

[3 of 3 positions shown; findings below may reference images not displayed]

FINDINGS: Frontal chest as well as oblique and cone-down rib images were
obtained. There is mild left base atelectasis. Lungs otherwise are
clear. Heart size and pulmonary vascularity are normal. No
adenopathy.

No pneumothorax or pleural effusion.  No evident rib fracture.
IMPRESSION: No evident rib fracture. No edema or airspace opacity. No
pneumothorax. Slight left base atelectasis noted.
